# Patient Record
Sex: Female | Born: 1990 | Race: Black or African American | Hispanic: No | Marital: Single | Smoking: Current every day smoker
Health system: Southern US, Community
[De-identification: ages and names within clinical notes are randomized; demographics above are authoritative.]

## PROBLEM LIST (undated history)

## (undated) DIAGNOSIS — J45909 Unspecified asthma, uncomplicated: Secondary | ICD-10-CM

## (undated) HISTORY — PX: TONSILLECTOMY: SUR1361

---

## 2017-11-24 ENCOUNTER — Emergency Department (HOSPITAL_COMMUNITY)
Admission: EM | Admit: 2017-11-24 | Discharge: 2017-11-24 | Disposition: A | Discharge status: Home / Self Care | Payer: BLUE CROSS/BLUE SHIELD | Attending: Emergency Medicine | Admitting: Emergency Medicine

## 2017-11-24 ENCOUNTER — Encounter (HOSPITAL_COMMUNITY): Payer: Self-pay | Admitting: *Deleted

## 2017-11-24 ENCOUNTER — Emergency Department (HOSPITAL_COMMUNITY): Payer: BLUE CROSS/BLUE SHIELD

## 2017-11-24 DIAGNOSIS — Y999 Unspecified external cause status: Secondary | ICD-10-CM | POA: Diagnosis not present

## 2017-11-24 DIAGNOSIS — W19XXXA Unspecified fall, initial encounter: Secondary | ICD-10-CM | POA: Diagnosis not present

## 2017-11-24 DIAGNOSIS — Z23 Encounter for immunization: Secondary | ICD-10-CM | POA: Diagnosis not present

## 2017-11-24 DIAGNOSIS — S80211A Abrasion, right knee, initial encounter: Secondary | ICD-10-CM | POA: Diagnosis not present

## 2017-11-24 DIAGNOSIS — S93402A Sprain of unspecified ligament of left ankle, initial encounter: Secondary | ICD-10-CM | POA: Insufficient documentation

## 2017-11-24 DIAGNOSIS — Y939 Activity, unspecified: Secondary | ICD-10-CM | POA: Insufficient documentation

## 2017-11-24 DIAGNOSIS — Y929 Unspecified place or not applicable: Secondary | ICD-10-CM | POA: Diagnosis not present

## 2017-11-24 DIAGNOSIS — S99912A Unspecified injury of left ankle, initial encounter: Secondary | ICD-10-CM | POA: Diagnosis present

## 2017-11-24 DIAGNOSIS — T148XXA Other injury of unspecified body region, initial encounter: Secondary | ICD-10-CM

## 2017-11-24 DIAGNOSIS — S93409A Sprain of unspecified ligament of unspecified ankle, initial encounter: Secondary | ICD-10-CM

## 2017-11-24 MED ORDER — TETANUS-DIPHTH-ACELL PERTUSSIS 5-2.5-18.5 LF-MCG/0.5 IM SUSP
0.5000 mL | Freq: Once | INTRAMUSCULAR | Status: AC
  Administered 2017-11-24: 0.5 mL via INTRAMUSCULAR
  Filled 2017-11-24: qty 0.5

## 2017-11-24 MED ORDER — BACITRACIN ZINC 500 UNIT/GM EX OINT
1.0000 "application " | TOPICAL_OINTMENT | Freq: Once | CUTANEOUS | Status: AC
  Administered 2017-11-24: 1 via TOPICAL
  Filled 2017-11-24: qty 0.9

## 2017-11-24 MED ORDER — METHOCARBAMOL 500 MG PO TABS: 1000.0000 mg | ORAL_TABLET | Freq: Four times a day (QID) | ORAL | 0 refills | Status: DC | PRN

## 2017-11-24 NOTE — ED Notes (Signed)
Pt ambulatory and independent at discharge.  Verbalized understanding of discharge instructions 

## 2017-11-24 NOTE — ED Triage Notes (Signed)
Patient c/o right knee and left ankle pain after fall x2 days ago. Healing wound noted to right knee. Ambulatory.

## 2017-11-24 NOTE — ED Provider Notes (Signed)
[facility]-EMERGENCY DEPT Provider Note   CSN: [id number] Arrival date & time: 11/24/17  1217     History   Chief Complaint Chief Complaint  Patient presents with  . Ankle Pain   HPI   Blood pressure 107/60, pulse 79, temperature 98.2 F (36.8 C), temperature source Oral, resp. rate 14, last menstrual period 11/24/2017, SpO2 96 %.  Tara Fry is a 27 y.o. female complaining of ankle and right knee pain after fall yesterday.  She has been ambulatory but with pain, she has an abrasion to the right knee.  Last tetanus shot is unknown.  Pain is moderate and not alleviated with Tylenol ibuprofen.    History reviewed. No pertinent past medical history.  There are no active problems to display for this patient.   Past Surgical History:  Procedure Laterality Date  . TONSILLECTOMY      OB History    No data available       Home Medications    Prior to Admission medications   Medication Sig Start Date End Date Taking? Authorizing Provider  albuterol (PROVENTIL HFA;VENTOLIN HFA) 108 (90 Base) MCG/ACT inhaler Inhale 1-2 puffs into the lungs every 6 (six) hours as needed for wheezing or shortness of breath.   Yes [provider]  ibuprofen (ADVIL,MOTRIN) 200 MG tablet Take 400 mg by mouth every 6 (six) hours as needed for moderate pain.   Yes [provider]  methocarbamol (ROBAXIN) 500 MG tablet Take 2 tablets (1,000 mg total) by mouth 4 (four) times daily as needed (Pain). 11/24/17   Zak Gondek, Mardella Layman    Family History No family history on file.  Social History Social History   Tobacco Use  . Smoking status: Not on file  Substance Use Topics  . Alcohol use: Not on file  . Drug use: Not on file     Allergies   Patient has no known allergies.   Review of Systems Review of Systems  A complete review of systems was obtained and all systems are negative except as noted in the HPI and PMH.   Physical Exam Updated  Vital Signs BP 107/60 (BP Location: Right Arm)   Pulse 79   Temp 98.2 F (36.8 C) (Oral)   Resp 14   LMP 11/24/2017   SpO2 96%   Physical Exam  Constitutional: She is oriented to person, place, and time. She appears well-developed and well-nourished. No distress.  HENT:  Head: Normocephalic and atraumatic.  Mouth/Throat: Oropharynx is clear and moist.  Eyes: Conjunctivae and EOM are normal. Pupils are equal, round, and reactive to light.  Neck: Normal range of motion.  Cardiovascular: Normal rate, regular rhythm and intact distal pulses.  Pulmonary/Chest: Effort normal and breath sounds normal.  Abdominal: Soft. There is no tenderness.  Musculoskeletal: Normal range of motion.  Left ankle:  No deformity, no overlying skin changes, mild swelling and tenderness to palpation along the inferior, lateral malleolus. No bony tenderness palpation, distally neurovascularly intact.  Right knee:  No deformity, erythema.  2.5 cm abrasion with no warmth or surrounding cellulitis.  FROM. No effusion or crepitance. Anterior and posterior drawer show no abnormal laxity. Stable to valgus and varus stress. Joint lines are non-tender. Neurovascularly intact. Pt ambulates with non-antalgic gait.     Neurological: She is alert and oriented to person, place, and time.  Skin: She is not diaphoretic.  Psychiatric: She has a normal mood and affect.  Nursing note and vitals reviewed.    ED  Treatments / Results  Labs (all labs ordered are listed, but only abnormal results are displayed) Labs Reviewed - No data to display  EKG  EKG Interpretation None       Radiology Dg Ankle Complete Left  Result Date: 11/24/2017 CLINICAL DATA:  Fall ankle pain EXAM: LEFT ANKLE COMPLETE - 3+ VIEW COMPARISON:  None. FINDINGS: There is no evidence of fracture, dislocation, or joint effusion. There is no evidence of arthropathy or other focal bone abnormality. Soft tissues are unremarkable. IMPRESSION: Negative.  Electronically Signed   By: Marlan Palauharles  Clark M.D.   On: 11/24/2017 12:59    Procedures Procedures (including critical care time)  Medications Ordered in ED Medications  bacitracin ointment 1 application (not administered)     Initial Impression / Assessment and Plan / ED Course  I have reviewed the triage vital signs and the nursing notes.  Pertinent labs & imaging results that were available during my care of the patient were reviewed by me and considered in my medical decision making (see chart for details).     Vitals:   11/24/17 1235  BP: 107/60  Pulse: 79  Resp: 14  Temp: 98.2 F (36.8 C)  TempSrc: Oral  SpO2: 96%    Medications  bacitracin ointment 1 application (not administered)    Tara Fry is [age] y.o. female presenting with left ankle and right knee pain status post fall.  Patient has significant edema to the left ankle x-ray negative, she is ambulatory, neurovascularly intact.  Right knee with abrasion, tetanus is updated have counseled her on wound care and return precautions.  Patient will be given crutches, Ace wrap.  Evaluation does not show pathology that would require ongoing emergent intervention or inpatient treatment. Pt is hemodynamically stable and mentating appropriately. Discussed findings and plan with patient/guardian, who agrees with care plan. All questions answered. Return precautions discussed and outpatient follow up given.      Final Clinical Impressions(s) / ED Diagnoses   Final diagnoses:  Sprain of ankle, unspecified laterality, unspecified ligament, initial encounter  Abrasion    ED Discharge Orders        Ordered    methocarbamol (ROBAXIN) 500 MG tablet  4 times daily PRN     11/24/17 1503       Cole Eastridge, Mardella Laymanicole, PA-C 11/24/17 1505    Doug SouJacubowitz, Sam, MD 11/24/17 1635

## 2017-11-24 NOTE — Discharge Instructions (Signed)
Wash the affected area with soap and water and apply a thin layer of topical antibiotic ointment. Do this every 12 hours.   Do not use rubbing alcohol or hydrogen peroxide.                        Look for signs of infection: if you see redness, if the area becomes warm, if pain increases sharply, there is discharge (pus), if red streaks appear or you develop fever or vomiting, RETURN immediately to the Emergency Department  for a recheck.    Rest, Ice intermittently (in the first 24-48 hours), Gentle compression with an Ace wrap, and elevate (Limb above the level of the heart)   Take up to 800mg  of ibuprofen (that is usually 4 over the counter pills)  3 times a day for 5 days. Take with food.   For pain control you may take up to 800mg  of Motrin (also known as ibuprofen). That is usually 4 over the counter pills,  3 times a day. Take with food to minimize stomach irritation   For breakthrough pain you may take Robaxin. Do not drink alcohol, drive or operate heavy machinery when taking Robaxin.  Please follow with your primary care doctor in the next 2 days for a check-up. They must obtain records for further management.   Do not hesitate to return to the Emergency Department for any new, worsening or concerning symptoms.

## 2018-03-09 ENCOUNTER — Ambulatory Visit (HOSPITAL_COMMUNITY)
Admission: EM | Admit: 2018-03-09 | Discharge: 2018-03-09 | Disposition: A | Discharge status: Home / Self Care | Payer: BLUE CROSS/BLUE SHIELD | Attending: Family Medicine | Admitting: Family Medicine

## 2018-03-09 ENCOUNTER — Encounter (HOSPITAL_COMMUNITY): Payer: Self-pay | Admitting: Family Medicine

## 2018-03-09 ENCOUNTER — Telehealth (HOSPITAL_COMMUNITY): Payer: Self-pay | Admitting: Emergency Medicine

## 2018-03-09 DIAGNOSIS — N898 Other specified noninflammatory disorders of vagina: Secondary | ICD-10-CM | POA: Insufficient documentation

## 2018-03-09 DIAGNOSIS — R300 Dysuria: Secondary | ICD-10-CM | POA: Diagnosis not present

## 2018-03-09 DIAGNOSIS — Z3202 Encounter for pregnancy test, result negative: Secondary | ICD-10-CM | POA: Diagnosis not present

## 2018-03-09 DIAGNOSIS — Z791 Long term (current) use of non-steroidal anti-inflammatories (NSAID): Secondary | ICD-10-CM | POA: Diagnosis not present

## 2018-03-09 DIAGNOSIS — Z79899 Other long term (current) drug therapy: Secondary | ICD-10-CM | POA: Insufficient documentation

## 2018-03-09 DIAGNOSIS — Z9889 Other specified postprocedural states: Secondary | ICD-10-CM | POA: Insufficient documentation

## 2018-03-09 LAB — POCT URINALYSIS DIP (DEVICE)
GLUCOSE, UA: NEGATIVE mg/dL
GLUCOSE, UA: NEGATIVE mg/dL
Hgb urine dipstick: NEGATIVE
KETONES UR: 40 mg/dL — AB
KETONES UR: 80 mg/dL — AB
Nitrite: NEGATIVE
Nitrite: NEGATIVE
Protein, ur: NEGATIVE mg/dL
Protein, ur: NEGATIVE mg/dL
SPECIFIC GRAVITY, URINE: 1.025 (ref 1.005–1.030)
Specific Gravity, Urine: 1.02 (ref 1.005–1.030)
UROBILINOGEN UA: 2 mg/dL — AB (ref 0.0–1.0)
UROBILINOGEN UA: 2 mg/dL — AB (ref 0.0–1.0)
pH: 6 (ref 5.0–8.0)
pH: 6.5 (ref 5.0–8.0)

## 2018-03-09 LAB — POCT PREGNANCY, URINE: Preg Test, Ur: NEGATIVE

## 2018-03-09 MED ORDER — METRONIDAZOLE 500 MG PO TABS: 500.0000 mg | ORAL_TABLET | Freq: Two times a day (BID) | ORAL | 0 refills | Status: DC

## 2018-03-09 MED ORDER — FLUCONAZOLE 150 MG PO TABS: 150.0000 mg | ORAL_TABLET | Freq: Every day | ORAL | 0 refills | Status: DC

## 2018-03-09 NOTE — ED Provider Notes (Signed)
[facility]    CSN: [id number] Arrival date & time: 03/09/18  1303     History   Chief Complaint Chief Complaint  Patient presents with  . Vaginitis    HPI Tara Fry is a 27 y.o. female.   27 year old female comes in for 2-day history of vaginal discharge/irritation.  States discharge is thick, can be clumpy.  Irritation to the labia.  Has some dysuria without urinary frequency, urgency, hematuria.  Has had some spotting, states contributed to recent start of NuvaRing use.  LMP 02/15/2018.  Denies fever, chills, night sweats.  Denies abdominal pain, nausea, vomiting.  Sexually active with one female partner, no condom use.     History reviewed. No pertinent past medical history.  There are no active problems to display for this patient.   Past Surgical History:  Procedure Laterality Date  . TONSILLECTOMY      OB History   None      Home Medications    Prior to Admission medications   Medication Sig Start Date End Date Taking? Authorizing Provider  albuterol (PROVENTIL HFA;VENTOLIN HFA) 108 (90 Base) MCG/ACT inhaler Inhale 1-2 puffs into the lungs every 6 (six) hours as needed for wheezing or shortness of breath.    [provider]  fluconazole (DIFLUCAN) 150 MG tablet Take 1 tablet (150 mg total) by mouth daily. Take second dose 72 hours later if symptoms still persists. 03/09/18   Cathie HoopsYu, Daritza Brees V, PA-C  ibuprofen (ADVIL,MOTRIN) 200 MG tablet Take 400 mg by mouth every 6 (six) hours as needed for moderate pain.    [provider]  metroNIDAZOLE (FLAGYL) 500 MG tablet Take 1 tablet (500 mg total) by mouth 2 (two) times daily. 03/09/18   Belinda FisherYu, Elvan Ebron V, PA-C    Family History History reviewed. No pertinent family history.  Social History Social History   Tobacco Use  . Smoking status: Never Smoker  . Smokeless tobacco: Never Used  Substance Use Topics  . Alcohol use: Not on file  . Drug use: Not on file     Allergies   Patient has no  known allergies.   Review of Systems Review of Systems  Reason unable to perform ROS: See HPI as above.     Physical Exam Triage Vital Signs ED Triage Vitals  Enc Vitals Group     BP 03/09/18 1337 127/67     Pulse Rate 03/09/18 1337 81     Resp 03/09/18 1337 18     Temp 03/09/18 1337 98.4 F (36.9 C)     Temp src --      SpO2 03/09/18 1337 100 %     Weight --      Height --      Head Circumference --      Peak Flow --      Pain Score 03/09/18 1335 10     Pain Loc --      Pain Edu? --      Excl. in GC? --    No data found.  Updated Vital Signs BP 127/67   Pulse 81   Temp 98.4 F (36.9 C)   Resp 18   LMP 02/15/2018   SpO2 100%   Physical Exam  Constitutional: She is oriented to person, place, and time. She appears well-developed and well-nourished. No distress.  HENT:  Head: Normocephalic and atraumatic.  Eyes: Pupils are equal, round, and reactive to light. Conjunctivae are normal.  Cardiovascular: Normal rate, regular rhythm and normal heart  sounds. Exam reveals no gallop and no friction rub.  No murmur heard. Pulmonary/Chest: Effort normal and breath sounds normal. She has no wheezes. She has no rales.  Abdominal: Soft. Bowel sounds are normal. She exhibits no mass. There is no tenderness. There is no rebound, no guarding and no CVA tenderness.  Genitourinary: There is no rash, tenderness or lesion on the right labia. There is no rash, tenderness or lesion on the left labia.  Genitourinary Comments: No ulceration/lesions found. Pelvic and bimanual exam deferred.   Neurological: She is alert and oriented to person, place, and time.  Skin: Skin is warm and dry.  Psychiatric: She has a normal mood and affect. Her behavior is normal. Judgment normal.     UC Treatments / Results  Labs (all labs ordered are listed, but only abnormal results are displayed) Labs Reviewed  POCT URINALYSIS DIP (DEVICE) - Abnormal; Notable for the following components:      Result  Value   Bilirubin Urine SMALL (*)    Ketones, ur 40 (*)    Hgb urine dipstick TRACE (*)    Urobilinogen, UA 2.0 (*)    Leukocytes, UA LARGE (*)    All other components within normal limits  URINE CULTURE  POCT PREGNANCY, URINE    EKG None Radiology No results found.  Procedures Procedures (including critical care time)  Medications Ordered in UC Medications - No data to display   Initial Impression / Assessment and Plan / UC Course  I have reviewed the triage vital signs and the nursing notes.  Pertinent labs & imaging results that were available during my care of the patient were reviewed by me and considered in my medical decision making (see chart for details).    Urine dipstick was ran with dirty urine. Clean catch obtained with small leukocytes. Will send for urine culture for further evaluation needed. Patient was treated empirically for BV and yeast. Start diflucan and flagyl as directed. Cytology sent, patient will be contacted with any positive results that require additional treatment. Patient to refrain from sexual activity for the next 7 days. Return precautions given.    Final Clinical Impressions(s) / UC Diagnoses   Final diagnoses:  Vaginal irritation    ED Discharge Orders        Ordered    metroNIDAZOLE (FLAGYL) 500 MG tablet  2 times daily     03/09/18 1434    fluconazole (DIFLUCAN) 150 MG tablet  Daily     03/09/18 1434        [address] [place] 03/09/18 1522

## 2018-03-09 NOTE — ED Triage Notes (Signed)
Pt here for vaginal discharge and vaginal irritation x 2 days.

## 2018-03-09 NOTE — Discharge Instructions (Addendum)
You were treated empirically for BV and yeast. Start diflucan and flagyl as directed. Cytology sent, you will be contacted with any positive results that requires further treatment. Refrain from sexual activity and alcohol use for the next 7 days. Monitor for any worsening of symptoms, fever, abdominal pain, nausea, vomiting, to follow up for reevaluation. ° °

## 2018-03-09 NOTE — Telephone Encounter (Signed)
Per Linward HeadlandAmy Fry request called pt to inform of clean urine results

## 2018-03-11 ENCOUNTER — Telehealth (HOSPITAL_COMMUNITY): Payer: Self-pay

## 2018-03-11 LAB — URINE CULTURE: Culture: 30000 — AB

## 2018-03-11 MED ORDER — NITROFURANTOIN MONOHYD MACRO 100 MG PO CAPS: 100.0000 mg | ORAL_CAPSULE | Freq: Two times a day (BID) | ORAL | 0 refills | Status: AC

## 2018-03-11 NOTE — Telephone Encounter (Signed)
Urine culture was positive for E. Coli, no antibiotics given at urgent care visit. Prescription for Macrobid 100 mg, BID x 5 days no refills  per Dr. Dayton ScrapeMurray sent to pharmacy of choice. Pt called and made aware. Pt educated to follow up if symptoms are not improving. Verbalized understanding.

## 2018-03-15 ENCOUNTER — Emergency Department (HOSPITAL_COMMUNITY)
Admission: EM | Admit: 2018-03-15 | Discharge: 2018-03-15 | Disposition: A | Discharge status: Home / Self Care | Payer: BLUE CROSS/BLUE SHIELD | Attending: Emergency Medicine | Admitting: Emergency Medicine

## 2018-03-15 ENCOUNTER — Encounter (HOSPITAL_COMMUNITY): Payer: Self-pay | Admitting: *Deleted

## 2018-03-15 DIAGNOSIS — N898 Other specified noninflammatory disorders of vagina: Secondary | ICD-10-CM | POA: Diagnosis present

## 2018-03-15 DIAGNOSIS — J45909 Unspecified asthma, uncomplicated: Secondary | ICD-10-CM | POA: Insufficient documentation

## 2018-03-15 HISTORY — DX: Unspecified asthma, uncomplicated: J45.909

## 2018-03-15 LAB — URINALYSIS, ROUTINE W REFLEX MICROSCOPIC
Bilirubin Urine: NEGATIVE
Glucose, UA: NEGATIVE mg/dL
HGB URINE DIPSTICK: NEGATIVE
Ketones, ur: NEGATIVE mg/dL
NITRITE: NEGATIVE
Protein, ur: NEGATIVE mg/dL
SPECIFIC GRAVITY, URINE: 1.018 (ref 1.005–1.030)
pH: 6 (ref 5.0–8.0)

## 2018-03-15 LAB — RAPID HIV SCREEN (HIV 1/2 AB+AG)
HIV 1/2 Antibodies: NONREACTIVE
HIV-1 P24 ANTIGEN - HIV24: NONREACTIVE

## 2018-03-15 LAB — WET PREP, GENITAL
Clue Cells Wet Prep HPF POC: NONE SEEN
Sperm: NONE SEEN
Trich, Wet Prep: NONE SEEN
YEAST WET PREP: NONE SEEN

## 2018-03-15 LAB — PREGNANCY, URINE: Preg Test, Ur: NEGATIVE

## 2018-03-15 MED ORDER — VALACYCLOVIR HCL 1 G PO TABS: 1000.0000 mg | ORAL_TABLET | Freq: Two times a day (BID) | ORAL | 0 refills | Status: AC

## 2018-03-15 MED ORDER — ACETAMINOPHEN 325 MG PO TABS
650.0000 mg | ORAL_TABLET | Freq: Once | ORAL | Status: AC
  Administered 2018-03-15: 650 mg via ORAL
  Filled 2018-03-15: qty 2

## 2018-03-15 NOTE — ED Triage Notes (Signed)
Pt stated "I have something that's sore near my butt crack.  It feels like a lump or something.  It came up Sunday morning."

## 2018-03-15 NOTE — ED Notes (Signed)
Rapid HIV non-reactive.

## 2018-03-15 NOTE — Discharge Instructions (Addendum)
You have chlamydia, gonorrhea, HIV, syphilis and herpes testing which is pending. You will get a phone call if positive.   Please take Valtrex twice a day for your symptoms. You can also take tylenol at home for pain.   Please refrain from sexual activity until the sores on your vagina are gone.   Please establish care with a regular doctor like we discussed.  There is a 1 [phone] number that you can call at the back of this packet to help you find a doctor.  I have also listed the information to the health department should you have future concerns.  Return to the ER if you have any new or concerning symptoms like fever, chills, vomiting that will not stop, abdominal pain.

## 2018-03-15 NOTE — ED Provider Notes (Signed)
[facility]ER[facility] Provider Note   CSN: [id number] Arrival date & time: 03/15/18  0607     History   Chief Complaint Chief Complaint  Patient presents with  . Abscess    HPI Teressa LowerJaslyn Chauca is a 27 y.o. female.  HPI  Ms. Andree Elkickelson is a 27 year old female with a history of asthma who presents to the emergency department for evaluation of vaginal irritation.  Patient states that she noticed a sore on her vagina yesterday which is painful to the touch.  She states the pain is 8/10 in severity and feels burning.  Reports that pain is worsened when she urinates or touches that area.  Has not taken any over-the-counter medications for her symptoms.  States that she feels as if the area is swollen.  She denies history of HSV.  She is in a monogamous relationship with one female partner, denies regular condom use.  States that she was seen at an urgent care about a week ago and diagnosed with BV, yeast infection.  Reports that her vaginal discharge has improved with diflucan and flagyl.  Reports LMP 02/15/2018.  She denies fevers, chills, abdominal pain, nausea/vomiting, urinary frequency, flank pain, hematuria.  Past Medical History:  Diagnosis Date  . Asthma     There are no active problems to display for this patient.   Past Surgical History:  Procedure Laterality Date  . TONSILLECTOMY       OB History   None      Home Medications    Prior to Admission medications   Medication Sig Start Date End Date Taking? Authorizing Provider  albuterol (PROVENTIL HFA;VENTOLIN HFA) 108 (90 Base) MCG/ACT inhaler Inhale 1-2 puffs into the lungs every 6 (six) hours as needed for wheezing or shortness of breath.   Yes [provider]  ibuprofen (ADVIL,MOTRIN) 200 MG tablet Take 400 mg by mouth every 6 (six) hours as needed for moderate pain.   Yes [provider]  metroNIDAZOLE (FLAGYL) 500 MG tablet Take 1 tablet (500 mg total) by mouth 2 (two)  times daily. 03/09/18  Yes Yu, Amy V, PA-C  nitrofurantoin, macrocrystal-monohydrate, (MACROBID) 100 MG capsule Take 1 capsule (100 mg total) by mouth 2 (two) times daily for 5 days. 03/11/18 03/16/18 Yes Isa RankinMurray, Laura Wilson, MD  NUVARING 0.12-0.015 MG/24HR vaginal ring Place 1 Device vaginally every 21 ( twenty-one) days. Then remove for 7 days 02/17/18  Yes [provider]  fluconazole (DIFLUCAN) 150 MG tablet Take 1 tablet (150 mg total) by mouth daily. Take second dose 72 hours later if symptoms still persists. Patient not taking: Reported on 03/15/2018 03/09/18   Lurline IdolYu, Amy V, PA-C    Family History No family history on file.  Social History Social History   Tobacco Use  . Smoking status: Never Smoker  . Smokeless tobacco: Never Used  Substance Use Topics  . Alcohol use: Yes    Comment: socially  . Drug use: Never     Allergies   Patient has no known allergies.   Review of Systems Review of Systems  Constitutional: Negative for chills and fever.  Gastrointestinal: Negative for abdominal pain, diarrhea, nausea and vomiting.  Genitourinary: Positive for dysuria and genital sores (sore on vagina). Negative for flank pain, frequency, vaginal bleeding and vaginal discharge.     Physical Exam Updated Vital Signs BP 123/71 (BP Location: Left Arm)   Pulse 74   Temp 98.2 F (36.8 C) (Oral)   Resp 16   Ht 5'  5" (1.651 m)   Wt 79.4 kg (175 lb)   LMP 02/15/2018 (Approximate)   SpO2 100%   BMI 29.12 kg/m   Physical Exam  Constitutional: She appears well-developed and well-nourished. No distress.  HENT:  Head: Normocephalic and atraumatic.  Eyes: Right eye exhibits no discharge. Left eye exhibits no discharge.  Pulmonary/Chest: Effort normal. No respiratory distress.  Abdominal: Soft. Bowel sounds are normal. There is no tenderness. There is no guarding.  Genitourinary:     Genitourinary Comments: Chaperone present for exam. One (~34mm) open ulcer on the inferior  labia majora as depicted in image. Two painful grouped vesicles over the left labia majora. White milky discharge present. No CMT. No adnexal masses, tenderness, or fullness.  No bleeding within vaginal vault. Nuva Ring present.  Neurological: She is alert. Coordination normal.  Skin: Skin is warm and dry. Capillary refill takes less than 2 seconds. She is not diaphoretic.  Psychiatric: She has a normal mood and affect. Her behavior is normal.  Nursing note and vitals reviewed.    ED Treatments / Results  Labs (all labs ordered are listed, but only abnormal results are displayed) Labs Reviewed  WET PREP, GENITAL - Abnormal; Notable for the following components:      Result Value   WBC, Wet Prep HPF POC FEW (*)    All other components within normal limits  URINALYSIS, ROUTINE W REFLEX MICROSCOPIC - Abnormal; Notable for the following components:   APPearance HAZY (*)    Leukocytes, UA SMALL (*)    Bacteria, UA RARE (*)    Squamous Epithelial / LPF 6-30 (*)    All other components within normal limits  HSV CULTURE AND TYPING  URINE CULTURE  RAPID HIV SCREEN (HIV 1/2 AB+AG)  PREGNANCY, URINE  RPR  GC/CHLAMYDIA PROBE AMP ([place]) NOT AT [facility]    EKG None  Radiology No results found.  Procedures Procedures (including critical care time)  Medications Ordered in ED Medications  acetaminophen (TYLENOL) tablet 650 mg (650 mg Oral Given 03/15/18 1118)     Initial Impression / Assessment and Plan / ED Course  I have reviewed the triage vital signs and the nursing notes.  Pertinent labs & imaging results that were available during my care of the patient were reviewed by me and considered in my medical decision making (see chart for details).     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  She has no prior history of this.  Plan to treat with 1 g Valtrex twice daily for the next 7 days.  Urine pregnancy negative.  Wet prep with few WBC's, otherwise unremarkable. UA appears  contaminated (mucus present) with small WBCs and rare bacteria.  No CMT, fever or abdominal pain. No concern for PID.  No mass, area of swelling or concern for abscess.  Patient requested HSV culture, which has been sent to the lab.  Patient understands that she also has GC/chlamydia, HIV, syphilis testing which is pending and that she will need to go to the health department to be treated if any of these tests return positive. Discussed follow-up with PCP and have given her information in her discharge paperwork to establish care.  Counseled her on return precautions and she agrees.  Final Clinical Impressions(s) / ED Diagnoses   Final diagnoses:  Vaginal lesion    ED Discharge Orders    None       Lawrence Marseilles 03/15/18 1604    Lorre Nick, MD 03/16/18 [id number]

## 2018-03-16 LAB — URINE CULTURE: Culture: 10000 — AB

## 2018-03-16 LAB — GC/CHLAMYDIA PROBE AMP (~~LOC~~) NOT AT ARMC
Chlamydia: NEGATIVE
NEISSERIA GONORRHEA: NEGATIVE

## 2018-03-16 LAB — RPR: RPR Ser Ql: NONREACTIVE

## 2018-03-17 ENCOUNTER — Telehealth: Payer: Self-pay | Admitting: Emergency Medicine

## 2018-03-17 LAB — HSV CULTURE AND TYPING

## 2018-03-17 NOTE — Telephone Encounter (Signed)
Post ED Visit - Positive Culture Follow-up  Culture report reviewed by antimicrobial stewardship pharmacist:  []  Tara Fry, Pharm.D. []  Tara Fry, [address]Ph[address] BCPS AQ-ID []  Tara Fry, Pharm.D., BCPS []  Tara Fry, Pharm.D., BCPS []  Tara Fry, [place] BCPS, AAHIVP []  Tara Fry, Pharm.D., BCPS, AAHIVP []  Tara Fry, PharmD, BCPS []  Tara Fry, PharmD []  Tara Fry, PharmD, BCPS Tara Fry PharmD  Positive urine culture Treated with none,  no further patient follow-up is required at this time.  Tara Fry, Tara Fry 03/17/2018, 1:45 PM

## 2018-03-17 NOTE — Progress Notes (Signed)
ED Antimicrobial Stewardship Positive Culture Follow Up   Tara Fry is an 27 y.o. female who presented to [facility]2018 with a chief complaint of vaginal irritation. Chief Complaint  Patient presents with  . Abscess    Recent Results (from the past 720 hour(s))  Urine culture     Status: Abnormal   Collection Time: 03/09/18  3:21 PM  Result Value Ref Range Status   Specimen Description URINE, CLEAN CATCH  Final   Special Requests   Final    NONE Performed at [facility], [address] [place], [place] [address]    Culture 30,000 COLONIES/mL ESCHERICHIA COLI (A)  Final   Report Status 03/11/2018 FINAL  Final   Organism ID, Bacteria ESCHERICHIA COLI (A)  Final      Susceptibility   Escherichia coli - MIC*    AMPICILLIN >=32 RESISTANT Resistant     CEFAZOLIN <=4 SENSITIVE Sensitive     CEFTRIAXONE <=1 SENSITIVE Sensitive     CIPROFLOXACIN <=0.25 SENSITIVE Sensitive     GENTAMICIN <=1 SENSITIVE Sensitive     IMIPENEM <=0.25 SENSITIVE Sensitive     NITROFURANTOIN 32 SENSITIVE Sensitive     TRIMETH/SULFA >=320 RESISTANT Resistant     AMPICILLIN/SULBACTAM >=32 RESISTANT Resistant     PIP/TAZO <=4 SENSITIVE Sensitive     Extended ESBL NEGATIVE Sensitive     * 30,000 COLONIES/mL ESCHERICHIA COLI  Wet prep, genital     Status: Abnormal   Collection Time: 03/15/18  9:57 AM  Result Value Ref Range Status   Yeast Wet Prep HPF POC NONE SEEN NONE SEEN Final   Trich, Wet Prep NONE SEEN NONE SEEN Final   Clue Cells Wet Prep HPF POC NONE SEEN NONE SEEN Final   WBC, Wet Prep HPF POC FEW (A) NONE SEEN Final   Sperm NONE SEEN  Final    Comment: Performed at [facility], [address] [place], [place] [address]  Urine culture     Status: Abnormal   Collection Time: 03/15/18 10:45 AM  Result Value Ref Range Status   Specimen Description   Final    URINE, CLEAN CATCH Performed at [facility], [address]  [place], [place] [address]    Special Requests   Final    NONE Performed at [facility], [address] [place], [place] [address]    Culture (A)  Final    10,000 COLONIES/mL GROUP B STREP(S.AGALACTIAE)ISOLATED TESTING AGAINST S. AGALACTIAE NOT ROUTINELY PERFORMED DUE TO PREDICTABILITY OF AMP/PEN/VAN SUSCEPTIBILITY. Performed at [facility], [address] [place], [place] [address]    Report Status 03/16/2018 FINAL  Final   Patient physical exam consistent with HSV infection. Patient given valacyclovir and HSV test pending.   Plan to finish valacyclovir course.  ED Provider: Elson AreasLeslie K Sofia PA-C   Anselm PancoastAmy Kohl Polinsky, PharmD Candidate 03/17/2018, 9:33 AM Phone# [phone]

## 2019-02-02 ENCOUNTER — Ambulatory Visit (HOSPITAL_COMMUNITY)
Admission: EM | Admit: 2019-02-02 | Discharge: 2019-02-02 | Disposition: A | Discharge status: Home / Self Care | Payer: BLUE CROSS/BLUE SHIELD | Attending: Family Medicine | Admitting: Family Medicine

## 2019-02-02 ENCOUNTER — Encounter (HOSPITAL_COMMUNITY): Payer: Self-pay | Admitting: Emergency Medicine

## 2019-02-02 DIAGNOSIS — Z113 Encounter for screening for infections with a predominantly sexual mode of transmission: Secondary | ICD-10-CM

## 2019-02-02 DIAGNOSIS — Z3202 Encounter for pregnancy test, result negative: Secondary | ICD-10-CM

## 2019-02-02 DIAGNOSIS — R8290 Unspecified abnormal findings in urine: Secondary | ICD-10-CM | POA: Insufficient documentation

## 2019-02-02 LAB — POCT URINALYSIS DIP (DEVICE)
Bilirubin Urine: NEGATIVE
Glucose, UA: NEGATIVE mg/dL
HGB URINE DIPSTICK: NEGATIVE
Ketones, ur: NEGATIVE mg/dL
NITRITE: POSITIVE — AB
PH: 7 (ref 5.0–8.0)
PROTEIN: NEGATIVE mg/dL
SPECIFIC GRAVITY, URINE: 1.02 (ref 1.005–1.030)
UROBILINOGEN UA: 0.2 mg/dL (ref 0.0–1.0)

## 2019-02-02 LAB — POCT PREGNANCY, URINE: Preg Test, Ur: NEGATIVE

## 2019-02-02 MED ORDER — NITROFURANTOIN MONOHYD MACRO 100 MG PO CAPS: 100.0000 mg | ORAL_CAPSULE | Freq: Two times a day (BID) | ORAL | 0 refills | Status: AC

## 2019-02-02 NOTE — ED Provider Notes (Signed)
[facility]    CSN: [id number] Arrival date & time: 02/02/19  1148     History   Chief Complaint Chief Complaint  Patient presents with  . Urine Odor  . SEXUALLY TRANSMITTED DISEASE    HPI Tara Fry is a 28 y.o. female history of asthma presenting today for evaluation of possible UTI versus STDs.  Patient states that over the past week she has had increased odor in her urine.  She notes that she has a history of UTI as well as BV.  Denies any vaginal discharge.  Denies any pelvic pain, itching or irritation.  Denies increased frequency with urination or dysuria.  She would like to be screened for STDs as well as UTI.  Denies fever, nausea vomiting or diarrhea.  Menstrual period ended approximately 1 week ago.  Is not on any form of birth control.  Denies any new partners. HPI  Past Medical History:  Diagnosis Date  . Asthma     There are no active problems to display for this patient.   Past Surgical History:  Procedure Laterality Date  . TONSILLECTOMY      OB History   No obstetric history on file.      Home Medications    Prior to Admission medications   Medication Sig Start Date End Date Taking? Authorizing Provider  albuterol (PROVENTIL HFA;VENTOLIN HFA) 108 (90 Base) MCG/ACT inhaler Inhale 1-2 puffs into the lungs every 6 (six) hours as needed for wheezing or shortness of breath.    [provider]  ibuprofen (ADVIL,MOTRIN) 200 MG tablet Take 400 mg by mouth every 6 (six) hours as needed for moderate pain.    [provider]  nitrofurantoin, macrocrystal-monohydrate, (MACROBID) 100 MG capsule Take 1 capsule (100 mg total) by mouth 2 (two) times daily for 5 days. 02/02/19 02/07/19  Joycie Aerts C, PA-C  NUVARING 0.12-0.015 MG/24HR vaginal ring Place 1 Device vaginally every 21 ( twenty-one) days. Then remove for 7 days 02/17/18   [provider]    Family History No family history on file.  Social History Social  History   Tobacco Use  . Smoking status: Never Smoker  . Smokeless tobacco: Never Used  Substance Use Topics  . Alcohol use: Yes    Comment: socially  . Drug use: Never     Allergies   Patient has no known allergies.   Review of Systems Review of Systems  Constitutional: Negative for fever.  Respiratory: Negative for shortness of breath.   Cardiovascular: Negative for chest pain.  Gastrointestinal: Negative for abdominal pain, diarrhea, nausea and vomiting.  Genitourinary: Negative for dysuria, flank pain, genital sores, hematuria, menstrual problem, vaginal bleeding, vaginal discharge and vaginal pain.  Musculoskeletal: Negative for back pain.  Skin: Negative for rash.  Neurological: Negative for dizziness, light-headedness and headaches.     Physical Exam Triage Vital Signs ED Triage Vitals [02/02/19 1211]  Enc Vitals Group     BP (!) 114/59     Pulse Rate 72     Resp 16     Temp 98.2 F (36.8 C)     Temp src      SpO2 100 %     Weight      Height      Head Circumference      Peak Flow      Pain Score 0     Pain Loc      Pain Edu?      Excl. in GC?  No data found.  Updated Vital Signs BP (!) 114/59   Pulse 72   Temp 98.2 F (36.8 C)   Resp 16   LMP 01/26/2019   SpO2 100%   Visual Acuity Right Eye Distance:   Left Eye Distance:   Bilateral Distance:    Right Eye Near:   Left Eye Near:    Bilateral Near:     Physical Exam Vitals signs and nursing note reviewed.  Constitutional:      Appearance: She is well-developed.     Comments: No acute distress  HENT:     Head: Normocephalic and atraumatic.     Nose: Nose normal.  Eyes:     Conjunctiva/sclera: Conjunctivae normal.  Neck:     Musculoskeletal: Neck supple.  Cardiovascular:     Rate and Rhythm: Normal rate.  Pulmonary:     Effort: Pulmonary effort is normal. No respiratory distress.  Abdominal:     General: There is no distension.     Comments: Nontender to light D palpation  throughout abdomen  Musculoskeletal: Normal range of motion.  Skin:    General: Skin is warm and dry.  Neurological:     Mental Status: She is alert and oriented to person, place, and time.      UC Treatments / Results  Labs (all labs ordered are listed, but only abnormal results are displayed) Labs Reviewed  POCT URINALYSIS DIP (DEVICE) - Abnormal; Notable for the following components:      Result Value   Nitrite POSITIVE (*)    Leukocytes,Ua TRACE (*)    All other components within normal limits  URINE CULTURE  POC URINE PREG, ED  CERVICOVAGINAL ANCILLARY ONLY    EKG None  Radiology No results found.  Procedures Procedures (including critical care time)  Medications Ordered in UC Medications - No data to display  Initial Impression / Assessment and Plan / UC Course  I have reviewed the triage vital signs and the nursing notes.  Pertinent labs & imaging results that were available during my care of the patient were reviewed by me and considered in my medical decision making (see chart for details).     Positive nitrites, trace leuks, will go ahead and empirically treat for UTI with Macrobid twice daily x5 days.  Urine culture sent, will call and alter treatment if needed.  Vaginal swab also obtained to check for STDs, BV and yeast.  Will inform of results.Discussed strict return precautions. Patient verbalized understanding and is agreeable with plan.  Final Clinical Impressions(s) / UC Diagnoses   Final diagnoses:  Bad odor of urine  Screen for STD (sexually transmitted disease)     Discharge Instructions     Urine showed evidence of infection. We are treating you with macrobid- twice daily for 5 days. Be sure to take full course. Stay hydrated- urine should be pale yellow to clear. My continue azo for relief of burning while infection is being cleared.   Please return or follow up with your primary provider if symptoms not improving with treatment. Please  return sooner if you have worsening of symptoms or develop fever, nausea, vomiting, abdominal pain, back pain, lightheadedness, dizziness.   ED Prescriptions    Medication Sig Dispense Auth. Provider   nitrofurantoin, macrocrystal-monohydrate, (MACROBID) 100 MG capsule Take 1 capsule (100 mg total) by mouth 2 (two) times daily for 5 days. 10 capsule Prather Failla C, PA-C     Controlled Substance Prescriptions [place] Controlled Substance Registry consulted? Not Applicable  Lew Dawes, PA-C 02/02/19 1252

## 2019-02-02 NOTE — Discharge Instructions (Signed)
Urine showed evidence of infection. We are treating you with macrobid twice daily for 5 days. Be sure to take full course. Stay hydrated- urine should be pale yellow to clear. My continue azo for relief of burning while infection is being cleared.  ° °Please return or follow up with your primary provider if symptoms not improving with treatment. Please return sooner if you have worsening of symptoms or develop fever, nausea, vomiting, abdominal pain, back pain, lightheadedness, dizziness. °

## 2019-02-02 NOTE — ED Triage Notes (Signed)
Pt requesting testing for UTI and STDs. Only symptom is her urine has an odor.

## 2019-02-03 LAB — CERVICOVAGINAL ANCILLARY ONLY
Bacterial vaginitis: NEGATIVE
CANDIDA VAGINITIS: POSITIVE — AB
Chlamydia: NEGATIVE
Neisseria Gonorrhea: NEGATIVE
TRICH (WINDOWPATH): NEGATIVE

## 2019-02-04 LAB — URINE CULTURE

## 2019-02-07 ENCOUNTER — Telehealth (HOSPITAL_COMMUNITY): Payer: Self-pay | Admitting: Emergency Medicine

## 2019-02-07 MED ORDER — FLUCONAZOLE 150 MG PO TABS: 150.0000 mg | ORAL_TABLET | Freq: Once | ORAL | 0 refills | Status: AC

## 2019-02-07 NOTE — Telephone Encounter (Signed)
Test for candida (yeast) was positive.  Prescription for fluconazole 150mg  po now, repeat dose in 3d if needed, #2 no refills, sent to the pharmacy of record.  Recheck or followup with PCP for further evaluation if symptoms are not improving.    Urine treated with macrobid.  Patient contacted and made aware of all results, all questions answered.

## 2019-02-17 ENCOUNTER — Emergency Department (HOSPITAL_COMMUNITY)
Admission: EM | Admit: 2019-02-17 | Discharge: 2019-02-17 | Disposition: A | Discharge status: Home / Self Care | Payer: BLUE CROSS/BLUE SHIELD | Attending: Emergency Medicine | Admitting: Emergency Medicine

## 2019-02-17 ENCOUNTER — Encounter (HOSPITAL_COMMUNITY): Payer: Self-pay

## 2019-02-17 ENCOUNTER — Other Ambulatory Visit: Payer: Self-pay

## 2019-02-17 ENCOUNTER — Emergency Department (HOSPITAL_COMMUNITY): Payer: BLUE CROSS/BLUE SHIELD

## 2019-02-17 DIAGNOSIS — Z79899 Other long term (current) drug therapy: Secondary | ICD-10-CM | POA: Insufficient documentation

## 2019-02-17 DIAGNOSIS — J069 Acute upper respiratory infection, unspecified: Secondary | ICD-10-CM | POA: Diagnosis not present

## 2019-02-17 DIAGNOSIS — J45909 Unspecified asthma, uncomplicated: Secondary | ICD-10-CM | POA: Insufficient documentation

## 2019-02-17 DIAGNOSIS — R05 Cough: Secondary | ICD-10-CM | POA: Diagnosis present

## 2019-02-17 MED ORDER — AEROCHAMBER PLUS FLO-VU MEDIUM MISC
1.0000 | Freq: Once | Status: AC
  Administered 2019-02-17: 1
  Filled 2019-02-17: qty 1

## 2019-02-17 MED ORDER — ALBUTEROL SULFATE HFA 108 (90 BASE) MCG/ACT IN AERS
10.0000 | INHALATION_SPRAY | Freq: Once | RESPIRATORY_TRACT | Status: AC
  Administered 2019-02-17: 10 via RESPIRATORY_TRACT
  Filled 2019-02-17: qty 6.7

## 2019-02-17 MED ORDER — BENZONATATE 100 MG PO CAPS: 100.0000 mg | ORAL_CAPSULE | Freq: Three times a day (TID) | ORAL | 0 refills | Status: AC

## 2019-02-17 NOTE — ED Provider Notes (Signed)
[facility] Provider Note   CSN: [id number] Arrival date & time: 02/17/19  1450    History   Chief Complaint Chief Complaint  Patient presents with  . Cough    HPI Tara Fry is a 28 y.o. female.     HPI   Pt is a 28 y/o female with a h/o asthma presents to the ED today for evaluation of chest tightness that began about 3 days ago. States she has had a dry cough for the last several days as well.  Denies rhinorrhea, congestion. Does report dry/sore throat. Denies known fevers at home. No body aches or chills.  Denies nausea, vomiting, diarrhea. She reports sob. She did not take her inhaler at home. She does have one.   States that her daughter was recently ill with fevers, abd pain, sore throat, and a cough. She was tested for the flu and strep and those tests were negative.  Denies leg pain/swelling, hemoptysis, recent surgery/trauma, recent long travel, hormone use, personal hx of cancer, or hx of DVT/PE. No recent foreign travel. No known exposures to COVID-19.  Past Medical History:  Diagnosis Date  . Asthma     There are no active problems to display for this patient.   Past Surgical History:  Procedure Laterality Date  . TONSILLECTOMY       OB History   No obstetric history on file.      Home Medications    Prior to Admission medications   Medication Sig Start Date End Date Taking? Authorizing Provider  albuterol (PROVENTIL HFA;VENTOLIN HFA) 108 (90 Base) MCG/ACT inhaler Inhale 1-2 puffs into the lungs every 6 (six) hours as needed for wheezing or shortness of breath.    [provider]  benzonatate (TESSALON) 100 MG capsule Take 1 capsule (100 mg total) by mouth every 8 (eight) hours for 5 days. 02/17/19 02/22/19  Lavon Horn S, PA-C  ibuprofen (ADVIL,MOTRIN) 200 MG tablet Take 400 mg by mouth every 6 (six) hours as needed for moderate pain.    [provider]  NUVARING 0.12-0.015 MG/24HR vaginal  ring Place 1 Device vaginally every 21 ( twenty-one) days. Then remove for 7 days 02/17/18   [provider]    Family History Family History  Problem Relation Age of Onset  . Healthy Mother   . Healthy Father     Social History Social History   Tobacco Use  . Smoking status: Never Smoker  . Smokeless tobacco: Never Used  Substance Use Topics  . Alcohol use: Yes    Comment: socially  . Drug use: Never     Allergies   Patient has no known allergies.   Review of Systems Review of Systems  Constitutional: Negative for fever.  HENT: Positive for sore throat. Negative for ear pain.   Eyes: Negative for visual disturbance.  Respiratory: Positive for cough and shortness of breath.   Cardiovascular: Negative for chest pain and leg swelling.  Gastrointestinal: Negative for abdominal pain, constipation, diarrhea, nausea and vomiting.  Genitourinary: Negative for dysuria and hematuria.  Musculoskeletal: Negative for back pain and myalgias.  Skin: Negative for color change and rash.  Neurological: Negative for headaches.  All other systems reviewed and are negative.   Physical Exam Updated Vital Signs BP 121/66 (BP Location: Left Arm)   Pulse 80   Temp 99.3 F (37.4 C) (Oral)   Resp 18   Ht 5\' 5"  (1.651 m)   Wt 78.7 kg   LMP 02/17/2019  SpO2 100%   BMI 28.87 kg/m   Physical Exam Vitals signs and nursing note reviewed.  Constitutional:      General: She is not in acute distress.    Appearance: She is well-developed. She is not ill-appearing or toxic-appearing.  HENT:     Head: Normocephalic and atraumatic.     Right Ear: Tympanic membrane normal.     Left Ear: Tympanic membrane normal.     Nose: Nose normal.     Mouth/Throat:     Mouth: Mucous membranes are moist.     Pharynx: No oropharyngeal exudate or posterior oropharyngeal erythema.     Comments: Tonsils not present Eyes:     Conjunctiva/sclera: Conjunctivae normal.  Neck:     Musculoskeletal:  Neck supple.  Cardiovascular:     Rate and Rhythm: Normal rate and regular rhythm.     Pulses: Normal pulses.     Heart sounds: Normal heart sounds. No murmur.  Pulmonary:     Effort: Pulmonary effort is normal. No respiratory distress.     Breath sounds: Normal breath sounds. No stridor. No wheezing, rhonchi or rales.  Abdominal:     General: Bowel sounds are normal. There is no distension.     Palpations: Abdomen is soft.     Tenderness: There is no abdominal tenderness. There is no guarding or rebound.  Musculoskeletal:        General: No tenderness.     Right lower leg: No edema.     Left lower leg: No edema.  Skin:    General: Skin is warm and dry.  Neurological:     Mental Status: She is alert.      ED Treatments / Results  Labs (all labs ordered are listed, but only abnormal results are displayed) Labs Reviewed - No data to display  EKG None  Radiology Dg Chest Portable 1 View  Result Date: 02/17/2019 CLINICAL DATA:  Nonproductive cough for several days EXAM: PORTABLE CHEST 1 VIEW COMPARISON:  None. FINDINGS: The heart size and mediastinal contours are within normal limits. Both lungs are clear. The visualized skeletal structures are unremarkable. IMPRESSION: No active disease. Electronically Signed   By: Alcide Clever M.D.   On: 02/17/2019 17:14    Procedures Procedures (including critical care time)  Medications Ordered in ED Medications  albuterol (PROVENTIL HFA;VENTOLIN HFA) 108 (90 Base) MCG/ACT inhaler 10 puff (10 puffs Inhalation Given 02/17/19 1602)  AeroChamber Plus Flo-Vu Medium MISC 1 each (1 each Other Given 02/17/19 1602)     Initial Impression / Assessment and Plan / ED Course  I have reviewed the triage vital signs and the nursing notes.  Pertinent labs & imaging results that were available during my care of the patient were reviewed by me and considered in my medical decision making (see chart for details).      Final Clinical Impressions(s)  / ED Diagnoses   Final diagnoses:  Upper respiratory tract infection, unspecified type   28 year old female presenting with dry cough and chest tightness.  Does have history of asthma.  Has not tried inhalers at home.  Daughter was recently sick with similar symptoms.  Temp 99 Fahrenheit here.  Otherwise her vital signs are stable.  Satting at 100% on room air.  Lungs clear to auscultation bilaterally.  Patient no distress.  No known contacts with Covid.  No recent foreign travel. Pt is nontoxic appearing. She does not meet current criteria for covid testing. Discussed deferring influenza testing as she would be  outside the window for tamiflu. After albuterol inhaler pt feels improved. Her CXR is negative. She is not tachypneic or hypoxic. I feel she is appropriate for outpt management of likely  [place]. Have advised to self quarantine per current guidelines. Advised to f/u closely with pcp and to return to the ED for new or worsening symptoms. All questions answered. Pt stable for dc.    ED Discharge Orders         Ordered    benzonatate (TESSALON) 100 MG capsule  Every 8 hours     02/17/19 1737           Karrie Meres, PA-C 02/17/19 1829    Raeford Razor, MD 02/17/19 2005

## 2019-02-17 NOTE — ED Triage Notes (Signed)
Patient c/o non productive cough x 3 days. Patient states her daughter has been sick with cough, sore throat, fever, and emesis. Patient denies any traveling or around anyone who travels, but does work at [employer]

## 2019-02-17 NOTE — Discharge Instructions (Addendum)
Please take 2 puffs of the inhaler every 6 hours as needed for cough, shortness of breath, or  chest tightness.   Take other prescribed medications as directed.   Please follow up with your primary care provider within 5-7 days for re-evaluation of your symptoms. If you do not have a primary care provider, information for a healthcare clinic has been provided for you to make arrangements for follow up care. Please return to the emergency department for any new or worsening symptoms.   You should be isolated for at least 7 days since the onset of your symptoms AND >72 hours after symptoms resolution (absence of fever without the use of fever reducing medication and improvement in respiratory symptoms), whichever is longer

## 2019-02-17 NOTE — ED Notes (Signed)
Pt states she has an inhaler but has not used it although reports feeling tight in throat area.

## 2019-05-19 IMAGING — DX PORTABLE CHEST - 1 VIEW
1 series · 1 of 1 positions shown · non-contrast
Comparison: None.

CLINICAL DATA: Nonproductive cough for several days

EXAM:
PORTABLE CHEST 1 VIEW

[chest ap]
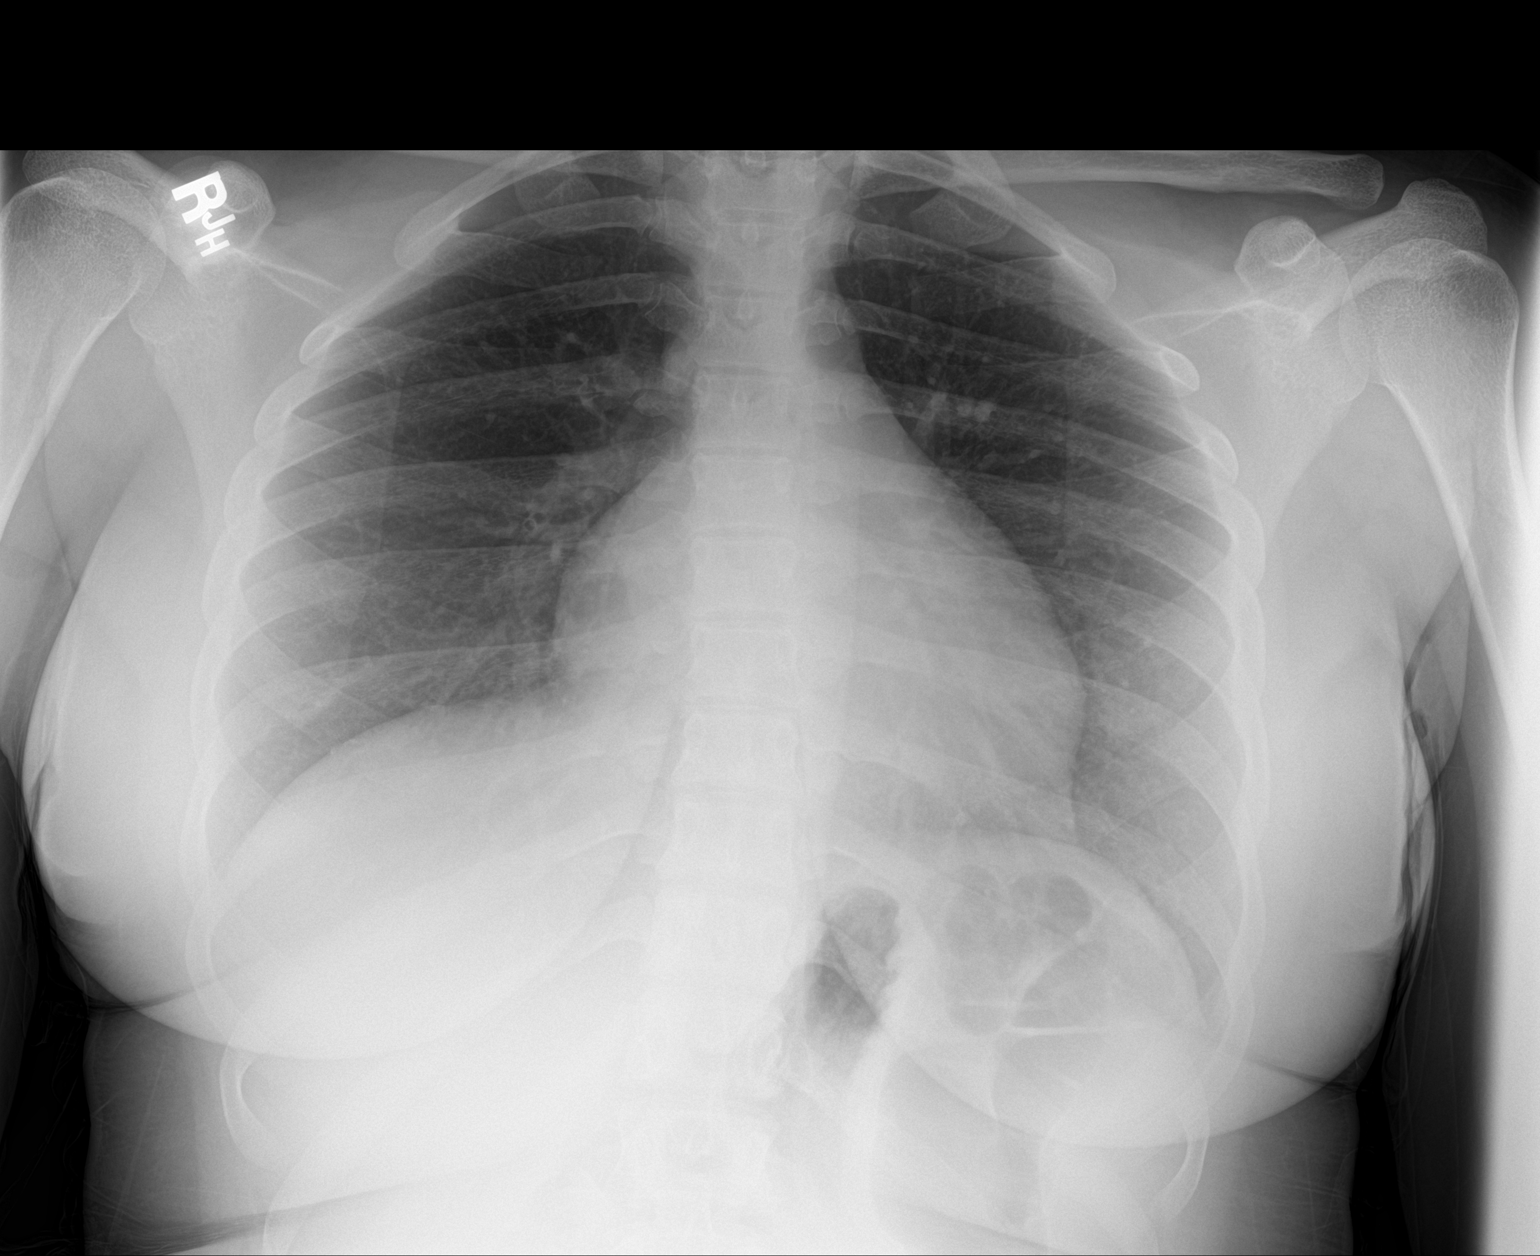

[1 of 1 positions shown; findings below may reference images not displayed]

FINDINGS: The heart size and mediastinal contours are within normal limits.
Both lungs are clear. The visualized skeletal structures are
unremarkable.
IMPRESSION: No active disease.

## 2019-07-13 ENCOUNTER — Other Ambulatory Visit: Payer: Self-pay

## 2019-07-13 DIAGNOSIS — Z20822 Contact with and (suspected) exposure to covid-19: Secondary | ICD-10-CM

## 2019-07-14 LAB — NOVEL CORONAVIRUS, NAA: SARS-CoV-2, NAA: NOT DETECTED

## 2019-08-15 ENCOUNTER — Encounter (HOSPITAL_COMMUNITY): Payer: Self-pay | Admitting: Emergency Medicine

## 2019-08-15 ENCOUNTER — Other Ambulatory Visit: Payer: Self-pay

## 2019-08-15 ENCOUNTER — Ambulatory Visit (HOSPITAL_COMMUNITY)
Admission: EM | Admit: 2019-08-15 | Discharge: 2019-08-15 | Disposition: A | Discharge status: Home / Self Care | Payer: Medicaid Other | Attending: Family Medicine | Admitting: Family Medicine

## 2019-08-15 DIAGNOSIS — N3000 Acute cystitis without hematuria: Secondary | ICD-10-CM | POA: Diagnosis present

## 2019-08-15 DIAGNOSIS — R350 Frequency of micturition: Secondary | ICD-10-CM | POA: Insufficient documentation

## 2019-08-15 DIAGNOSIS — Z3202 Encounter for pregnancy test, result negative: Secondary | ICD-10-CM | POA: Diagnosis not present

## 2019-08-15 LAB — POCT URINALYSIS DIP (DEVICE)
Bilirubin Urine: NEGATIVE
Glucose, UA: NEGATIVE mg/dL
Hgb urine dipstick: NEGATIVE
Ketones, ur: NEGATIVE mg/dL
Leukocytes,Ua: NEGATIVE
Nitrite: POSITIVE — AB
Protein, ur: NEGATIVE mg/dL
Specific Gravity, Urine: >=1.03 (ref 1.005–1.030)
Urobilinogen, UA: 0.2 mg/dL (ref 0.0–1.0)
pH: 5.5 (ref 5.0–8.0)

## 2019-08-15 LAB — POCT PREGNANCY, URINE: Preg Test, Ur: NEGATIVE

## 2019-08-15 MED ORDER — SULFAMETHOXAZOLE-TRIMETHOPRIM 800-160 MG PO TABS: 1.0000 | ORAL_TABLET | Freq: Two times a day (BID) | ORAL | 0 refills | Status: DC

## 2019-08-15 NOTE — ED Provider Notes (Signed)
MRN: [id number] DOB: 1991/08/14  Subjective:   Tara Fry is a 28 y.o. female presenting for 2 to 3-day history of moderate urinary frequency with urgency and malodorous urine.  Patient has a history of UTIs.  Tries to manage these well but admits that she does not hydrate as well as she can.  No current facility-administered medications for this encounter.   Current Outpatient Medications:  .  albuterol (PROVENTIL HFA;VENTOLIN HFA) 108 (90 Base) MCG/ACT inhaler, Inhale 1-2 puffs into the lungs every 6 (six) hours as needed for wheezing or shortness of breath., Disp: , Rfl:  .  ibuprofen (ADVIL,MOTRIN) 200 MG tablet, Take 400 mg by mouth every 6 (six) hours as needed for moderate pain., Disp: , Rfl:  .  NUVARING 0.12-0.015 MG/24HR vaginal ring, Place 1 Device vaginally every 21 ( twenty-one) days. Then remove for 7 days, Disp: , Rfl: 0   No Known Allergies  Past Medical History:  Diagnosis Date  . Asthma      Past Surgical History:  Procedure Laterality Date  . TONSILLECTOMY      Review of Systems  Constitutional: Negative for chills and fever.  Respiratory: Negative for shortness of breath.   Cardiovascular: Negative for chest pain.  Gastrointestinal: Negative for abdominal pain, nausea and vomiting.  Genitourinary: Positive for frequency and urgency. Negative for dysuria, flank pain and hematuria.  Musculoskeletal: Negative for myalgias.  Skin: Negative for rash.  Neurological: Negative for dizziness and headaches.    Objective:   Vitals: BP 106/64 (BP Location: Right Arm)   Pulse 76   Temp 98.7 F (37.1 C) (Oral)   Resp 18   LMP 08/09/2019   SpO2 100%   Physical Exam Constitutional:      General: She is not in acute distress.    Appearance: Normal appearance. She is well-developed. She is not ill-appearing.  HENT:     Head: Normocephalic and atraumatic.     Nose: Nose normal.     Mouth/Throat:     Mouth: Mucous membranes are moist.     Pharynx:  Oropharynx is clear.  Eyes:     General: No scleral icterus.    Extraocular Movements: Extraocular movements intact.     Pupils: Pupils are equal, round, and reactive to light.  Cardiovascular:     Rate and Rhythm: Normal rate.  Pulmonary:     Effort: Pulmonary effort is normal.  Skin:    General: Skin is warm and dry.  Neurological:     General: No focal deficit present.     Mental Status: She is alert and oriented to person, place, and time.  Psychiatric:        Mood and Affect: Mood normal.        Behavior: Behavior normal.     Results for orders placed or performed during the hospital encounter of 08/15/19 (from the past 24 hour(s))  POCT urinalysis dip (device)     Status: Abnormal   Collection Time: 08/15/19  5:34 PM  Result Value Ref Range   Glucose, UA NEGATIVE NEGATIVE mg/dL   Bilirubin Urine NEGATIVE NEGATIVE   Ketones, ur NEGATIVE NEGATIVE mg/dL   Specific Gravity, Urine >=1.030 1.005 - 1.030   Hgb urine dipstick NEGATIVE NEGATIVE   pH 5.5 5.0 - 8.0   Protein, ur NEGATIVE NEGATIVE mg/dL   Urobilinogen, UA 0.2 0.0 - 1.0 mg/dL   Nitrite POSITIVE (A) NEGATIVE   Leukocytes,Ua NEGATIVE NEGATIVE  Pregnancy, urine POC     Status: None  Collection Time: 08/15/19  5:40 PM  Result Value Ref Range   Preg Test, Ur NEGATIVE NEGATIVE    Assessment and Plan :   1. Acute cystitis without hematuria   2. Urinary frequency     Start Bactrim to cover for cystitis, urine culture pending. Counseled patient on potential for adverse effects with medications prescribed/recommended today, ER and return-to-clinic precautions discussed, patient verbalized understanding.    Jaynee Eagles, [place] 08/15/19 1827

## 2019-08-15 NOTE — ED Notes (Signed)
POC and UA collected at 1736 by [place] EMT

## 2019-08-15 NOTE — ED Triage Notes (Signed)
Approximately 2 days ago, noticed odor to urine, noticed frequent urination.  History of uti.

## 2019-08-17 LAB — URINE CULTURE: Culture: 80000 — AB

## 2019-08-18 ENCOUNTER — Telehealth (HOSPITAL_COMMUNITY): Payer: Self-pay | Admitting: Emergency Medicine

## 2019-08-18 NOTE — Telephone Encounter (Signed)
Urine culture was positive for  and was given bactrim  at urgent care visit. Attempted to reach patient. No answer at this time.

## 2019-08-19 ENCOUNTER — Encounter: Payer: Self-pay | Admitting: Podiatry

## 2019-08-19 ENCOUNTER — Other Ambulatory Visit: Payer: Self-pay

## 2019-08-19 ENCOUNTER — Ambulatory Visit (INDEPENDENT_AMBULATORY_CARE_PROVIDER_SITE_OTHER): Payer: Medicaid Other

## 2019-08-19 ENCOUNTER — Ambulatory Visit: Payer: Medicaid Other | Admitting: Podiatry

## 2019-08-19 VITALS — BP 111/65 | HR 78 | Resp 20

## 2019-08-19 DIAGNOSIS — M722 Plantar fascial fibromatosis: Secondary | ICD-10-CM | POA: Diagnosis not present

## 2019-08-19 DIAGNOSIS — M79671 Pain in right foot: Secondary | ICD-10-CM

## 2019-08-19 MED ORDER — METHYLPREDNISOLONE 4 MG PO TBPK
ORAL_TABLET | ORAL | 0 refills | Status: DC
Start: 2019-08-19 — End: 2023-10-12

## 2019-08-19 NOTE — Patient Instructions (Signed)

## 2019-08-19 NOTE — Progress Notes (Signed)
Subjective:  Patient ID: Tara Fry, female    DOB: 04/15/1991,  MRN: [id number]  Chief Complaint  Patient presents with  . Foot Pain    NP: "It's a sharp pain in my heels.  I been feeling it about a year.  My left one gives me more trouble.  I bought compression socks to help but it only helped a little bit.  It hurts when I stand up from laying down a while."    28 y.o. female presents with the above complaint.  She states that she has sharp pain in both of her heels the pain has been for about a year patient states that the left forearm gives her more trouble than the right.she has tried compression therapy on both of her feet which has did not help at all   Review of Systems: Negative except as noted in the HPI. Denies N/V/F/Ch.  Past Medical History:  Diagnosis Date  . Asthma     Current Outpatient Medications:  .  albuterol (PROVENTIL HFA;VENTOLIN HFA) 108 (90 Base) MCG/ACT inhaler, Inhale 1-2 puffs into the lungs every 6 (six) hours as needed for wheezing or shortness of breath., Disp: , Rfl:  .  ibuprofen (ADVIL,MOTRIN) 200 MG tablet, Take 400 mg by mouth every 6 (six) hours as needed for moderate pain., Disp: , Rfl:  .  methylPREDNISolone (MEDROL DOSEPAK) 4 MG TBPK tablet, Use as directed, Disp: 1 each, Rfl: 0 .  sulfamethoxazole-trimethoprim (BACTRIM DS) 800-160 MG tablet, Take 1 tablet by mouth 2 (two) times daily., Disp: 10 tablet, Rfl: 0  Social History   Tobacco Use  Smoking Status Current Every Day Smoker  . Packs/day: 1.00  . Types: Cigars  Smokeless Tobacco Never Used    No Known Allergies Objective:   Vitals:   08/19/19 1143  BP: 111/65  Pulse: 78  Resp: 20   There is no height or weight on file to calculate BMI. Constitutional Well developed. Well nourished.  Vascular Dorsalis pedis pulses palpable bilaterally. Posterior tibial pulses palpable bilaterally. Capillary refill normal to all digits.  No cyanosis or clubbing noted. Pedal hair  growth normal.  Neurologic Normal speech. Oriented to person, place, and time. Epicritic sensation to light touch grossly present bilaterally.  Dermatologic Nails well groomed and normal in appearance. No open wounds. No skin lesions.  Orthopedic: Normal joint ROM without pain or crepitus bilaterally. No visible deformities. Tender to palpation at the calcaneal tuber bilaterally. No pain with calcaneal squeeze bilaterally. Ankle ROM diminished range of motion bilaterally. Silfverskiold Test: positive bilaterally.   Radiographs: Taken and reviewed. No acute fractures or dislocations. No evidence of stress fracture.  Plantar heel spur present. Posterior heel spur present.   Assessment:   1. Plantar fasciitis, bilateral   2. Plantar fasciitis of right foot   3. Plantar fasciitis, left   4. Pain in both feet    Plan:  Patient was evaluated and treated and all questions answered.  Plantar Fasciitis, bilaterally - XR reviewed as above.  - Educated on icing and stretching. Instructions given.  - Injection delivered to the plantar fascia as below. - DME: Plantar Fascial Brace x 2 - Pharmacologic management: Meloxicam/Medrol Dose Pak. Educated on risks/benefits and proper taking of medication.  Procedure: Injection Tendon/Ligament Location: Bilateral plantar fascia at the glabrous junction; medial approach. Skin Prep: alcohol Injectate: 0.5 cc 0.5% marcaine plain, 0.5 cc of 1% Lidocaine, 0.5 cc kenalog 10. Disposition: Patient tolerated procedure well. Injection site dressed with a band-aid.  Return  in about 4 weeks (around 09/16/2019).

## 2019-08-22 ENCOUNTER — Encounter: Payer: Self-pay | Admitting: Podiatry

## 2019-09-19 ENCOUNTER — Ambulatory Visit: Payer: Medicaid Other | Admitting: Podiatry

## 2019-11-30 ENCOUNTER — Other Ambulatory Visit: Payer: Self-pay

## 2019-11-30 DIAGNOSIS — Z20822 Contact with and (suspected) exposure to covid-19: Secondary | ICD-10-CM

## 2019-12-01 LAB — NOVEL CORONAVIRUS, NAA: SARS-CoV-2, NAA: NOT DETECTED

## 2019-12-09 ENCOUNTER — Other Ambulatory Visit: Payer: Self-pay

## 2019-12-09 ENCOUNTER — Ambulatory Visit: Payer: Medicaid Other | Admitting: Podiatry

## 2019-12-09 ENCOUNTER — Encounter: Payer: Self-pay | Admitting: Podiatry

## 2019-12-09 DIAGNOSIS — M722 Plantar fascial fibromatosis: Secondary | ICD-10-CM

## 2019-12-09 DIAGNOSIS — M79672 Pain in left foot: Secondary | ICD-10-CM

## 2019-12-09 DIAGNOSIS — M7731 Calcaneal spur, right foot: Secondary | ICD-10-CM

## 2019-12-09 DIAGNOSIS — M79671 Pain in right foot: Secondary | ICD-10-CM

## 2019-12-09 NOTE — Progress Notes (Signed)
Subjective:  Patient ID: Tara Fry, female    DOB: [birth year],  MRN: [id number]  Chief Complaint  Patient presents with  . Foot Pain    pt is here for a f/u on bil plantar fasciitis, pt states that she is not feeling better since the last time she was here, the injection she recieved last time has not helped, pt also states that both of her foot pain is elevated in the morning, or when trying to get up. pt states that the right pain is worse than the left    29 y.o. female presents with the above complaint.  Agree with the above except patient states her left plantar fasciitis pain has completely resolved she does not have any pain on the heel.  However her right plantar fasciitis pain has mildly decreased but she still has pretty good amount of pain to the right side.  She tried the plantar fascial brace which has helped a little bit.  She states her pain is about 8 out of 10.  Pain is elevated in the morning and try to get up.  She denies any other acute complaints.   Review of Systems: Negative except as noted in the HPI. Denies N/V/F/Ch.  Past Medical History:  Diagnosis Date  . Asthma     Current Outpatient Medications:  .  albuterol (PROVENTIL HFA;VENTOLIN HFA) 108 (90 Base) MCG/ACT inhaler, Inhale 1-2 puffs into the lungs every 6 (six) hours as needed for wheezing or shortness of breath., Disp: , Rfl:  .  ibuprofen (ADVIL,MOTRIN) 200 MG tablet, Take 400 mg by mouth every 6 (six) hours as needed for moderate pain., Disp: , Rfl:  .  methylPREDNISolone (MEDROL DOSEPAK) 4 MG TBPK tablet, Use as directed, Disp: 1 each, Rfl: 0 .  sulfamethoxazole-trimethoprim (BACTRIM DS) 800-160 MG tablet, Take 1 tablet by mouth 2 (two) times daily., Disp: 10 tablet, Rfl: 0  Social History   Tobacco Use  Smoking Status Current Every Day Smoker  . Packs/day: 1.00  . Types: Cigars  Smokeless Tobacco Never Used    No Known Allergies Objective:   There were no vitals filed for this  visit. There is no height or weight on file to calculate BMI. Constitutional Well developed. Well nourished.  Vascular Dorsalis pedis pulses palpable bilaterally. Posterior tibial pulses palpable bilaterally. Capillary refill normal to all digits.  No cyanosis or clubbing noted. Pedal hair growth normal.  Neurologic Normal speech. Oriented to person, place, and time. Epicritic sensation to light touch grossly present bilaterally.  Dermatologic Nails well groomed and normal in appearance. No open wounds. No skin lesions.  Orthopedic: Normal joint ROM without pain or crepitus bilaterally. No visible deformities. Tender to palpation at the calcaneal tuber left skin No pain with calcaneal squeeze bilaterally. Ankle ROM diminished range of motion bilaterally. Silfverskiold Test: positive bilaterally    Radiographs: None  Assessment:   1. Plantar fasciitis of right foot   2. Plantar fasciitis, left   3. Pain in both feet    Plan:  Patient was evaluated and treated and all questions answered.  Left plantar fasciitis -Resolved  Plantar Fasciitis, right - XR reviewed as above.  - Re-Educated on icing and stretching. Instructions given.  - Injection delivered to the plantar fascia as below. - DME: None - Pharmacologic management: None  -Explained to the patient if her pain does not give resolved on the right plantar fasciitis during next visit I believe patient will benefit for a cam boot immobilization.  Patient  agrees with the plan.  Procedure: Injection Tendon/Ligament Location: Rightplantar fascia at the glabrous junction; medial approach. Skin Prep: alcohol Injectate: 0.5 cc 0.5% marcaine plain, 0.5 cc of 1% Lidocaine, 0.5 cc kenalog 10. Disposition: Patient tolerated procedure well. Injection site dressed with a band-aid.  No follow-ups on file.

## 2019-12-12 ENCOUNTER — Ambulatory Visit: Payer: Medicaid Other | Admitting: Podiatry

## 2020-01-06 ENCOUNTER — Ambulatory Visit: Payer: Medicaid Other | Admitting: Podiatry

## 2020-01-06 ENCOUNTER — Other Ambulatory Visit: Payer: Self-pay

## 2020-01-06 DIAGNOSIS — M722 Plantar fascial fibromatosis: Secondary | ICD-10-CM

## 2020-01-06 DIAGNOSIS — M79671 Pain in right foot: Secondary | ICD-10-CM

## 2020-01-06 DIAGNOSIS — Z01818 Encounter for other preprocedural examination: Secondary | ICD-10-CM | POA: Diagnosis not present

## 2020-01-06 DIAGNOSIS — M79672 Pain in left foot: Secondary | ICD-10-CM | POA: Diagnosis not present

## 2020-01-06 NOTE — Patient Instructions (Signed)
Pre-Operative Instructions  Congratulations, you have decided to take an important step towards improving your quality of life.  You can be assured that the doctors and staff at [facility] will be with you every step of the way.  Here are some important things you should know:  1. Plan to be at the surgery center/hospital at least 1 (one) hour prior to your scheduled time, unless otherwise directed by the surgical center/hospital staff.  You must have a responsible adult accompany you, remain during the surgery and drive you home.  Make sure you have directions to the surgical center/hospital to ensure you arrive on time. 2. If you are having surgery at [facility] or [facility] hospitals, you will need a copy of your medical history and physical form from your family physician within one month prior to the date of surgery. We will give you a form for your primary physician to complete.  3. We make every effort to accommodate the date you request for surgery.  However, there are times where surgery dates or times have to be moved.  We will contact you as soon as possible if a change in schedule is required.   4. No aspirin/ibuprofen for one week before surgery.  If you are on aspirin, any non-steroidal anti-inflammatory medications (Mobic, Aleve, Ibuprofen) should not be taken seven (7) days prior to your surgery.  You make take Tylenol for pain prior to surgery.  5. Medications - If you are taking daily heart and blood pressure medications, seizure, reflux, allergy, asthma, anxiety, pain or diabetes medications, make sure you notify the surgery center/hospital before the day of surgery so they can tell you which medications you should take or avoid the day of surgery. 6. No food or drink after midnight the night before surgery unless directed otherwise by surgical center/hospital staff. 7. No alcoholic beverages 24-hours prior to surgery.  No smoking 24-hours prior or 24-hours after  surgery. 8. Wear loose pants or shorts. They should be loose enough to fit over bandages, boots, and casts. 9. Don't wear slip-on shoes. Sneakers are preferred. 10. Bring your boot with you to the surgery center/hospital.  Also bring crutches or a walker if your physician has prescribed it for you.  If you do not have this equipment, it will be provided for you after surgery. 11. If you have not been contacted by the surgery center/hospital by the day before your surgery, call to confirm the date and time of your surgery. 12. Leave-time from work may vary depending on the type of surgery you have.  Appropriate arrangements should be made prior to surgery with your employer. 13. Prescriptions will be provided immediately following surgery by your doctor.  Fill these as soon as possible after surgery and take the medication as directed. Pain medications will not be refilled on weekends and must be approved by the doctor. 14. Remove nail polish on the operative foot and avoid getting pedicures prior to surgery. 15. Wash the night before surgery.  The night before surgery wash the foot and leg well with water and the antibacterial soap provided. Be sure to pay special attention to beneath the toenails and in between the toes.  Wash for at least three (3) minutes. Rinse thoroughly with water and dry well with a towel.  Perform this wash unless told not to do so by your physician.  Enclosed: 1 Ice pack (please put in freezer the night before surgery)   1 Hibiclens skin cleaner     Pre-op instructions  If you have any questions regarding the instructions, please do not hesitate to call our office.  [place]: [address], [place], [place] [address] -- [phone]  [place]: [address] [place], [place] [address] -- [phone]  [place]: [address], [place], [place] [address] -- [phone]   Website: [email or web address] 

## 2020-01-09 ENCOUNTER — Encounter: Payer: Self-pay | Admitting: Podiatry

## 2020-01-09 NOTE — Progress Notes (Signed)
Subjective:  Patient ID: Tara Fry, female    DOB: 1991/01/21,  MRN: [id number]  Chief Complaint  Patient presents with  . Foot Pain    pt is here for a f/u of plantar fasciitis of both feet, pt states that her left foot is feeling a lot better, but is more concerned of the right foot not healing, pt has tried wearing the cam boot, as well as using the injection but states that she is still in pain, pt puts pain scale as a 8 out of 22.     29 y.o. female presents with the above complaint.  Patient is here on a follow-up of the right plantar fasciitis.  She states that the left side has completely healed without any pain.  However the right side is not responding the same.  Patient has tried injections multiple as well as various offloading braces but none of the pain has decreased.  She states that she continues to do stretching exercises she has immobilized the foot in the cam boot but has not helped at all.  Today she is ambulating with a cam boot on but her pain has been about the same.  She denies any other acute complaints at this time given that she has failed all conservative therapy she would like to discuss surgical intervention.  Her pain scale is 8 out of 10.  She has been ambulating in regular sneakers.   Review of Systems: Negative except as noted in the HPI. Denies N/V/F/Ch.  Past Medical History:  Diagnosis Date  . Asthma     Current Outpatient Medications:  .  albuterol (PROVENTIL HFA;VENTOLIN HFA) 108 (90 Base) MCG/ACT inhaler, Inhale 1-2 puffs into the lungs every 6 (six) hours as needed for wheezing or shortness of breath., Disp: , Rfl:  .  ibuprofen (ADVIL,MOTRIN) 200 MG tablet, Take 400 mg by mouth every 6 (six) hours as needed for moderate pain., Disp: , Rfl:  .  methylPREDNISolone (MEDROL DOSEPAK) 4 MG TBPK tablet, Use as directed, Disp: 1 each, Rfl: 0 .  sulfamethoxazole-trimethoprim (BACTRIM DS) 800-160 MG tablet, Take 1 tablet by mouth 2 (two) times daily.,  Disp: 10 tablet, Rfl: 0  Social History   Tobacco Use  Smoking Status Current Every Day Smoker  . Packs/day: 1.00  . Types: Cigars  Smokeless Tobacco Never Used    No Known Allergies Objective:   There were no vitals filed for this visit. There is no height or weight on file to calculate BMI. Constitutional Well developed. Well nourished.  Vascular Dorsalis pedis pulses palpable bilaterally. Posterior tibial pulses palpable bilaterally. Capillary refill normal to all digits.  No cyanosis or clubbing noted. Pedal hair growth normal.  Neurologic Normal speech. Oriented to person, place, and time. Epicritic sensation to light touch grossly present bilaterally.  Dermatologic Nails well groomed and normal in appearance. No open wounds. No skin lesions.  Orthopedic: Normal joint ROM without pain or crepitus bilaterally. No visible deformities. Tender to palpation at the calcaneal tuber left skin No pain with calcaneal squeeze bilaterally. Ankle ROM diminished range of motion bilaterally. Silfverskiold Test: positive bilaterally    Radiographs: None  Assessment:   1. Plantar fasciitis of right foot   2. Pain in both feet   3. Preop examination    Plan:  Patient was evaluated and treated and all questions answered.  Left plantar fasciitis -Resolved  Plantar Fasciitis, right -I discussed all my radiographic findings as well as my clinical findings with the patient.  Given that she has had no significant improvement on the right side in the setting of failing all conservative therapy, I believe patient will benefit from right endoscopic plantar fasciotomy with gastrocnemius recession.  Patient states understanding would like to proceed with a surgical intervention at this time given she has via failed all conservative therapy. -I have discussed all my postoperative management with the patient including weightbearing as tolerated with a cam boot after the surgery for 3 weeks  until the sutures are ready to come out.  Patient states understanding and will continue to use a cam boot after the surgery -Informed surgical risk consent was reviewed and read aloud to the patient.  I reviewed the films.  I have discussed my findings with the patient in great detail.  I have discussed all risks including but not limited to infection, stiffness, scarring, limp, disability, deformity, damage to blood vessels and nerves, numbness, poor healing, need for braces, arthritis, chronic pain, amputation, death.  All benefits and realistic expectations discussed in great detail.  I have made no promises as to the outcome.  I have provided realistic expectations.  I have offered the patient a 2nd opinion, which they have declined and assured me they preferred to proceed despite the risks   No follow-ups on file.

## 2020-01-23 DIAGNOSIS — M216X1 Other acquired deformities of right foot: Secondary | ICD-10-CM | POA: Diagnosis not present

## 2020-01-23 DIAGNOSIS — M722 Plantar fascial fibromatosis: Secondary | ICD-10-CM

## 2020-01-23 MED ORDER — OXYCODONE-ACETAMINOPHEN 10-325 MG PO TABS: 1.0000 | ORAL_TABLET | Freq: Four times a day (QID) | ORAL | 0 refills | Status: AC | PRN

## 2020-01-23 MED ORDER — IBUPROFEN 200 MG PO TABS: 400.0000 mg | ORAL_TABLET | Freq: Four times a day (QID) | ORAL | 1 refills | Status: DC | PRN

## 2020-01-23 NOTE — Addendum Note (Signed)
Addended by: Nicholes Rough on: 01/23/2020 07:48 AM   Modules accepted: Orders

## 2020-01-27 ENCOUNTER — Telehealth: Payer: Self-pay | Admitting: Podiatry

## 2020-01-27 NOTE — Telephone Encounter (Signed)
Pt had surgery on 01/23/20 and believes her bandages might be too tight and would like to know if she is able to unwrap the bandages. Please give patient a call

## 2020-01-27 NOTE — Telephone Encounter (Signed)
I spoke with pt and informed that she could remove the boot, open-ended sock, ace wrap only and elevate the foot for 15 minute, then place the foot level with her hip and beginning at the toes rewrap the ace rolling loosely down the foot and up the leg, replace the sock and boot.

## 2020-02-01 ENCOUNTER — Ambulatory Visit (INDEPENDENT_AMBULATORY_CARE_PROVIDER_SITE_OTHER): Payer: Medicaid Other | Admitting: Podiatry

## 2020-02-01 ENCOUNTER — Other Ambulatory Visit: Payer: Self-pay

## 2020-02-01 DIAGNOSIS — M722 Plantar fascial fibromatosis: Secondary | ICD-10-CM

## 2020-02-01 DIAGNOSIS — M79672 Pain in left foot: Secondary | ICD-10-CM

## 2020-02-01 DIAGNOSIS — Z9889 Other specified postprocedural states: Secondary | ICD-10-CM

## 2020-02-01 DIAGNOSIS — M79671 Pain in right foot: Secondary | ICD-10-CM

## 2020-02-02 ENCOUNTER — Encounter: Payer: Self-pay | Admitting: Podiatry

## 2020-02-02 NOTE — Progress Notes (Signed)
  Subjective:  Patient ID: Tara Fry, female    DOB: 02-03-1991,  MRN: [id number]  Chief Complaint  Patient presents with  . Routine Post Op    POV #1 DOS 01/23/2020 EPF RT, GASTROCNEMIUS RT, pt states that the right foot is not painful, but more of an uncomfortable sensation.      29 y.o. female returns for post-op check.  Patient presents today after undergoing the listed procedure above.  Patient states that she is doing really well.  She does not have any pain but has uncomfortable feeling.  There is some bruising in the calf which is expected.  Patient may wear the cam boot ambulating as tolerated patient has been weaning off the pain medication.  She denies any other acute complaints.  Review of Systems: Negative except as noted in the HPI. Denies N/V/F/Ch.  Past Medical History:  Diagnosis Date  . Asthma     Current Outpatient Medications:  .  albuterol (PROVENTIL HFA;VENTOLIN HFA) 108 (90 Base) MCG/ACT inhaler, Inhale 1-2 puffs into the lungs every 6 (six) hours as needed for wheezing or shortness of breath., Disp: , Rfl:  .  ibuprofen (ADVIL) 200 MG tablet, Take 2 tablets (400 mg total) by mouth every 6 (six) hours as needed for moderate pain., Disp: 30 tablet, Rfl: 1 .  methylPREDNISolone (MEDROL DOSEPAK) 4 MG TBPK tablet, Use as directed, Disp: 1 each, Rfl: 0 .  sulfamethoxazole-trimethoprim (BACTRIM DS) 800-160 MG tablet, Take 1 tablet by mouth 2 (two) times daily., Disp: 10 tablet, Rfl: 0  Social History   Tobacco Use  Smoking Status Current Every Day Smoker  . Packs/day: 1.00  . Types: Cigars  Smokeless Tobacco Never Used    No Known Allergies Objective:  There were no vitals filed for this visit. There is no height or weight on file to calculate BMI. Constitutional Well developed. Well nourished.  Vascular Foot warm and well perfused. Capillary refill normal to all digits.   Neurologic Normal speech. Oriented to person, place, and time. Epicritic  sensation to light touch grossly present bilaterally.  Dermatologic Skin healing well without signs of infection. Skin edges well coapted without signs of infection.  Orthopedic: Tenderness to palpation noted about the surgical site.   Radiographs: None Assessment:  No diagnosis found. Plan:  Patient was evaluated and treated and all questions answered.  S/p foot surgery right -Progressing as expected post-operatively. -XR: None -WB Status: Weightbearing as tolerated in cam boot -Sutures: Intact.  Without any signs of dehiscence.  I plan on removing the stitches during next visit. -Medications: None -Foot redressed.  No follow-ups on file.

## 2020-02-08 ENCOUNTER — Other Ambulatory Visit: Payer: Self-pay

## 2020-02-08 ENCOUNTER — Ambulatory Visit (INDEPENDENT_AMBULATORY_CARE_PROVIDER_SITE_OTHER): Payer: Medicaid Other | Admitting: Podiatry

## 2020-02-08 ENCOUNTER — Encounter: Payer: Self-pay | Admitting: Podiatry

## 2020-02-08 DIAGNOSIS — Z9889 Other specified postprocedural states: Secondary | ICD-10-CM

## 2020-02-08 DIAGNOSIS — M722 Plantar fascial fibromatosis: Secondary | ICD-10-CM

## 2020-02-08 NOTE — Progress Notes (Signed)
  Subjective:  Patient ID: Tara Fry, female    DOB: [birth year],  MRN: [id number]  Chief Complaint  Patient presents with  . Routine Post Op    DOS 3.1.2021 EPF RT, GASTROCNEMIUS RT. Pt states no concerns, denies constitutional symptoms.     29 y.o. female returns for post-op check.  She is doing well overall.  She does not have much pain but her pain is well controlled.  She has been ambulating with regular sneakers without any acute problems.  She denies any other clinical signs of infection.  Review of Systems: Negative except as noted in the HPI. Denies N/V/F/Ch.  Past Medical History:  Diagnosis Date  . Asthma     Current Outpatient Medications:  .  albuterol (PROVENTIL HFA;VENTOLIN HFA) 108 (90 Base) MCG/ACT inhaler, Inhale 1-2 puffs into the lungs every 6 (six) hours as needed for wheezing or shortness of breath., Disp: , Rfl:  .  ibuprofen (ADVIL) 200 MG tablet, Take 2 tablets (400 mg total) by mouth every 6 (six) hours as needed for moderate pain., Disp: 30 tablet, Rfl: 1 .  methylPREDNISolone (MEDROL DOSEPAK) 4 MG TBPK tablet, Use as directed, Disp: 1 each, Rfl: 0 .  sulfamethoxazole-trimethoprim (BACTRIM DS) 800-160 MG tablet, Take 1 tablet by mouth 2 (two) times daily., Disp: 10 tablet, Rfl: 0  Social History   Tobacco Use  Smoking Status Current Every Day Smoker  . Packs/day: 1.00  . Types: Cigars  Smokeless Tobacco Never Used    No Known Allergies Objective:  There were no vitals filed for this visit. There is no height or weight on file to calculate BMI. Constitutional Well developed. Well nourished.  Vascular Foot warm and well perfused. Capillary refill normal to all digits.   Neurologic Normal speech. Oriented to person, place, and time. Epicritic sensation to light touch grossly present bilaterally.  Dermatologic Skin healing well without signs of infection. Skin edges well coapted without signs of infection.  Orthopedic: Tenderness to palpation  noted about the surgical site.   Radiographs: None Assessment:  No diagnosis found. Plan:  Patient was evaluated and treated and all questions answered.  S/p foot surgery right -Progressing as expected post-operatively. -XR: None -WB Status: Weightbearing as tolerated in cam boot can slowly start transitioning to regular sneakers next week -Sutures: Intact.  Patient is oriented.  Not reassessed.  No clinical signs of infection noted.  -Foot redressed. -Patient can start getting it wet starting 1 week from today.  Patient states understanding.  No follow-ups on file.

## 2020-02-22 ENCOUNTER — Other Ambulatory Visit: Payer: Self-pay

## 2020-02-22 ENCOUNTER — Encounter: Payer: Self-pay | Admitting: Podiatry

## 2020-02-22 ENCOUNTER — Ambulatory Visit (INDEPENDENT_AMBULATORY_CARE_PROVIDER_SITE_OTHER): Payer: Medicaid Other | Admitting: Podiatry

## 2020-02-22 DIAGNOSIS — Z9889 Other specified postprocedural states: Secondary | ICD-10-CM

## 2020-02-22 DIAGNOSIS — M722 Plantar fascial fibromatosis: Secondary | ICD-10-CM

## 2020-02-22 NOTE — Progress Notes (Signed)
  Subjective:  Patient ID: Tara Fry, female    DOB: [birth year],  MRN: [id number]  Chief Complaint  Patient presents with  . Routine Post Op     POV #3 DOS 01/23/2020 EPF RT, GASTROCNEMIUS RT, pt has no pain, pt also states she is looking to get her foot wet.     29 y.o. female returns for post-op check.  She is doing well overall.  She states that she does not have pain anymore.  She states that she out of the boot and start washing her foot she denies any other acute complaints. Review of Systems: Negative except as noted in the HPI. Denies N/V/F/Ch.  Past Medical History:  Diagnosis Date  . Asthma     Current Outpatient Medications:  .  albuterol (PROVENTIL HFA;VENTOLIN HFA) 108 (90 Base) MCG/ACT inhaler, Inhale 1-2 puffs into the lungs every 6 (six) hours as needed for wheezing or shortness of breath., Disp: , Rfl:  .  ibuprofen (ADVIL) 200 MG tablet, Take 2 tablets (400 mg total) by mouth every 6 (six) hours as needed for moderate pain., Disp: 30 tablet, Rfl: 1 .  methylPREDNISolone (MEDROL DOSEPAK) 4 MG TBPK tablet, Use as directed, Disp: 1 each, Rfl: 0 .  sulfamethoxazole-trimethoprim (BACTRIM DS) 800-160 MG tablet, Take 1 tablet by mouth 2 (two) times daily., Disp: 10 tablet, Rfl: 0  Social History   Tobacco Use  Smoking Status Current Every Day Smoker  . Packs/day: 1.00  . Types: Cigars  Smokeless Tobacco Never Used    No Known Allergies Objective:  There were no vitals filed for this visit. There is no height or weight on file to calculate BMI. Constitutional Well developed. Well nourished.  Vascular Foot warm and well perfused. Capillary refill normal to all digits.   Neurologic Normal speech. Oriented to person, place, and time. Epicritic sensation to light touch grossly present bilaterally.  Dermatologic  skin completely reepithelialized.  No clinical signs of infection noted.  Orthopedic: No Tenderness to palpation noted about the surgical site.    Radiographs: None Assessment:   1. Plantar fasciitis of right foot   2. Status post foot surgery    Plan:  Patient was evaluated and treated and all questions answered.  S/p foot surgery right -Progressing as expected post-operatively. -XR: None -WB Status: Transition to regular sneakers -Sutures: Intact.  Patient is oriented.  Not reassessed.  No clinical signs of infection noted.  -Foot redressed. -Given the patient has clinically improved she can start beginning to try to gradually transition to a new balance A6 of [employer] and out of the cam boot.  I discussed transition.  As well as break-in period with the patient in great detail.  She denies any other acute complaints and she will start transitioning.  Return in about 4 weeks (around 03/21/2020).

## 2020-03-23 ENCOUNTER — Ambulatory Visit: Payer: Medicaid Other | Admitting: Podiatry

## 2020-03-23 ENCOUNTER — Other Ambulatory Visit: Payer: Self-pay

## 2020-09-21 ENCOUNTER — Other Ambulatory Visit: Payer: Self-pay

## 2020-09-21 ENCOUNTER — Encounter: Payer: Self-pay | Admitting: Podiatry

## 2020-09-21 ENCOUNTER — Ambulatory Visit (INDEPENDENT_AMBULATORY_CARE_PROVIDER_SITE_OTHER): Payer: Medicaid Other | Admitting: Podiatry

## 2020-09-21 DIAGNOSIS — M722 Plantar fascial fibromatosis: Secondary | ICD-10-CM

## 2020-09-21 DIAGNOSIS — M7732 Calcaneal spur, left foot: Secondary | ICD-10-CM

## 2020-09-21 NOTE — Patient Instructions (Signed)

## 2020-09-21 NOTE — Progress Notes (Signed)
Subjective:  Patient ID: Tara Fry, female    DOB: 1991/06/27,  MRN: [id number]  Chief Complaint  Patient presents with  . Numbness    PT stated that she had surgery back in march and is still having numbness and her foot is still sore     29 y.o. female presents with the above complaint.  Patient presents with complaint of left heel pain that started over the last few months.  Patient states the right side which is where I did an operation with an endoscopic plantar fasciotomy with gastrocnemius release.  Patient states overall she is doing little bit better there.  She notices occasional pain to the right side.  However the left side has started acting back up again.  She states it hurts to get out of bed and take the first.  She would like to know if there is any other treatment options that are available to her.  She denies any other acute complaints.  She has done some stretching exercises which seems to help.  She has not tried any other treatment options   Review of Systems: Negative except as noted in the HPI. Denies N/V/F/Ch.  Past Medical History:  Diagnosis Date  . Asthma     Current Outpatient Medications:  .  albuterol (PROVENTIL HFA;VENTOLIN HFA) 108 (90 Base) MCG/ACT inhaler, Inhale 1-2 puffs into the lungs every 6 (six) hours as needed for wheezing or shortness of breath., Disp: , Rfl:  .  COVID-19 Specimen Collection KIT, SWAB AS DIRECTED, Disp: , Rfl:  .  ibuprofen (ADVIL) 200 MG tablet, Take 2 tablets (400 mg total) by mouth every 6 (six) hours as needed for moderate pain., Disp: 30 tablet, Rfl: 1 .  methylPREDNISolone (MEDROL DOSEPAK) 4 MG TBPK tablet, Use as directed, Disp: 1 each, Rfl: 0 .  prednisoLONE acetate (PRED FORTE) 1 % ophthalmic suspension, SMARTSIG:In Eye(s), Disp: , Rfl:  .  RESTASIS 0.05 % ophthalmic emulsion, 1 drop 2 (two) times daily., Disp: , Rfl:  .  sulfamethoxazole-trimethoprim (BACTRIM DS) 800-160 MG tablet, Take 1 tablet by mouth 2 (two)  times daily., Disp: 10 tablet, Rfl: 0  Social History   Tobacco Use  Smoking Status Current Every Day Smoker  . Packs/day: 1.00  . Types: Cigars  Smokeless Tobacco Never Used    No Known Allergies Objective:  There were no vitals filed for this visit. There is no height or weight on file to calculate BMI. Constitutional Well developed. Well nourished.  Vascular Dorsalis pedis pulses palpable bilaterally. Posterior tibial pulses palpable bilaterally. Capillary refill normal to all digits.  No cyanosis or clubbing noted. Pedal hair growth normal.  Neurologic Normal speech. Oriented to person, place, and time. Epicritic sensation to light touch grossly present bilaterally.  Dermatologic Nails well groomed and normal in appearance. No open wounds. No skin lesions.  Orthopedic: Normal joint ROM without pain or crepitus bilaterally. No visible deformities. Tender to palpation at the calcaneal tuber left. No pain with calcaneal squeeze left. Ankle ROM diminished range of motion left. Silfverskiold Test: positive left.   Radiographs: None  Assessment:   1. Plantar fasciitis of left foot   2. Heel spur, left    Plan:  Patient was evaluated and treated and all questions answered.  Plantar Fasciitis, left - XR reviewed as above.  - Educated on icing and stretching. Instructions given.  - Injection delivered to the plantar fascia as below. - DME: Plantar Fascial Brace - Pharmacologic management: None  Procedure: Injection Tendon/Ligament  Location: Left plantar fascia at the glabrous junction; medial approach. Skin Prep: alcohol Injectate: 0.5 cc 0.5% marcaine plain, 0.5 cc of 1% Lidocaine, 0.5 cc kenalog 10. Disposition: Patient tolerated procedure well. Injection site dressed with a band-aid.  No follow-ups on file.

## 2020-10-24 ENCOUNTER — Ambulatory Visit: Payer: Medicaid Other | Admitting: Podiatry

## 2020-11-24 HISTORY — PX: LIPOSUCTION: SHX10

## 2021-01-01 ENCOUNTER — Other Ambulatory Visit: Payer: Self-pay

## 2021-01-01 ENCOUNTER — Encounter (HOSPITAL_COMMUNITY): Payer: Self-pay | Admitting: Emergency Medicine

## 2021-01-01 ENCOUNTER — Ambulatory Visit (HOSPITAL_COMMUNITY)
Admission: EM | Admit: 2021-01-01 | Discharge: 2021-01-01 | Disposition: A | Discharge status: Home / Self Care | Payer: Medicaid Other | Attending: Emergency Medicine | Admitting: Emergency Medicine

## 2021-01-01 DIAGNOSIS — R1030 Lower abdominal pain, unspecified: Secondary | ICD-10-CM | POA: Diagnosis not present

## 2021-01-01 DIAGNOSIS — Z3202 Encounter for pregnancy test, result negative: Secondary | ICD-10-CM | POA: Diagnosis not present

## 2021-01-01 DIAGNOSIS — R8290 Unspecified abnormal findings in urine: Secondary | ICD-10-CM

## 2021-01-01 DIAGNOSIS — K5900 Constipation, unspecified: Secondary | ICD-10-CM

## 2021-01-01 LAB — POCT URINALYSIS DIPSTICK, ED / UC
Bilirubin Urine: NEGATIVE
Glucose, UA: NEGATIVE mg/dL
Hgb urine dipstick: NEGATIVE
Ketones, ur: NEGATIVE mg/dL
Leukocytes,Ua: NEGATIVE
Nitrite: NEGATIVE
Protein, ur: NEGATIVE mg/dL
Specific Gravity, Urine: 1.015 (ref 1.005–1.030)
Urobilinogen, UA: 0.2 mg/dL (ref 0.0–1.0)
pH: 7 (ref 5.0–8.0)

## 2021-01-01 LAB — POC URINE PREG, ED: Preg Test, Ur: NEGATIVE

## 2021-01-01 NOTE — Discharge Instructions (Signed)
Take Colace and MiraLAX as directed.    Go to the emergency department if you have acute abdominal pain or other concerning symptoms.    Your urine is normal.  Your pregnancy test is negative.    Follow up with your primary care provider if your symptoms are not improving.

## 2021-01-01 NOTE — ED Triage Notes (Signed)
Pt presents with lower abdominal pain and odor to urine xs 1 week. States has hx of constipation.

## 2021-01-01 NOTE — ED Provider Notes (Signed)
[facility]    CSN: [id number] Arrival date & time: 01/01/21  1448      History   Chief Complaint Chief Complaint  Patient presents with  . Abdominal Pain    HPI Tara Fry is a 30 y.o. female.   Patient presents with lower abdominal pain and malodorous urine x1 week.  She also states she has a history of constipation and has not had a bowel movement in 3 days.  She denies fever, chills, vomiting, diarrhea, dysuria, hematuria, vaginal discharge, pelvic pain, or other symptoms.  Treatment attempted at home with herbal tea this morning.  Her medical history includes asthma; current everyday smoker.  The history is provided by the patient and medical records.    Past Medical History:  Diagnosis Date  . Asthma     There are no problems to display for this patient.   Past Surgical History:  Procedure Laterality Date  . TONSILLECTOMY      OB History   No obstetric history on file.      Home Medications    Prior to Admission medications   Medication Sig Start Date End Date Taking? Authorizing Provider  albuterol (PROVENTIL HFA;VENTOLIN HFA) 108 (90 Base) MCG/ACT inhaler Inhale 1-2 puffs into the lungs every 6 (six) hours as needed for wheezing or shortness of breath.    [provider]  COVID-19 Specimen Collection KIT SWAB AS DIRECTED 07/22/20   [provider]  ibuprofen (ADVIL) 200 MG tablet Take 2 tablets (400 mg total) by mouth every 6 (six) hours as needed for moderate pain. 01/23/20   Felipa Furnace, DPM  methylPREDNISolone (MEDROL DOSEPAK) 4 MG TBPK tablet Use as directed 08/19/19   Felipa Furnace, DPM  prednisoLONE acetate (PRED FORTE) 1 % ophthalmic suspension SMARTSIG:In Eye(s) 05/10/20   [provider]  RESTASIS 0.05 % ophthalmic emulsion 1 drop 2 (two) times daily. 05/10/20   [provider]  sulfamethoxazole-trimethoprim (BACTRIM DS) 800-160 MG tablet Take 1 tablet by mouth 2 (two) times daily. 08/15/19   Jaynee Eagles, PA-C  NUVARING 0.12-0.015 MG/24HR vaginal ring Place 1 Device vaginally every 21 ( twenty-one) days. Then remove for 7 days 02/17/18 08/15/19  [provider]    Family History Family History  Problem Relation Age of Onset  . Healthy Mother   . Healthy Father     Social History Social History   Tobacco Use  . Smoking status: Current Every Day Smoker    Packs/day: 1.00    Types: Cigars  . Smokeless tobacco: Never Used  Vaping Use  . Vaping Use: Never used  Substance Use Topics  . Alcohol use: Not Currently    Comment: socially  . Drug use: Never     Allergies   Patient has no known allergies.   Review of Systems Review of Systems  Constitutional: Negative for chills and fever.  HENT: Negative for ear pain and sore throat.   Eyes: Negative for pain and visual disturbance.  Respiratory: Negative for cough and shortness of breath.   Cardiovascular: Negative for chest pain and palpitations.  Gastrointestinal: Positive for abdominal pain and constipation. Negative for diarrhea, nausea and vomiting.  Genitourinary: Negative for dysuria, flank pain, hematuria, pelvic pain and vaginal discharge.       Malodorous urine  Musculoskeletal: Negative for arthralgias and back pain.  Skin: Negative for color change and rash.  Neurological: Negative for seizures and syncope.  All other systems reviewed and are negative.  Physical Exam Triage Vital Signs ED Triage Vitals  Enc Vitals Group     BP 01/01/21 1015 111/67     Pulse Rate 01/01/21 1015 66     Resp 01/01/21 1015 18     Temp 01/01/21 1015 98.8 F (37.1 C)     Temp Source 01/01/21 1015 Oral     SpO2 01/01/21 1015 100 %     Weight --      Height --      Head Circumference --      Peak Flow --      Pain Score 01/01/21 1013 3     Pain Loc --      Pain Edu? --      Excl. in GC? --    No data found.  Updated Vital Signs BP 111/67 (BP Location: Right Arm)   Pulse 66   Temp 98.8 F (37.1 C)  (Oral)   Resp 18   LMP 12/21/2020   SpO2 100%   Visual Acuity Right Eye Distance:   Left Eye Distance:   Bilateral Distance:    Right Eye Near:   Left Eye Near:    Bilateral Near:     Physical Exam Vitals and nursing note reviewed.  Constitutional:      General: She is not in acute distress.    Appearance: She is well-developed and well-nourished. She is not ill-appearing.  HENT:     Head: Normocephalic and atraumatic.     Mouth/Throat:     Mouth: Mucous membranes are moist.  Eyes:     Conjunctiva/sclera: Conjunctivae normal.  Cardiovascular:     Rate and Rhythm: Normal rate and regular rhythm.     Heart sounds: Normal heart sounds.  Pulmonary:     Effort: Pulmonary effort is normal. No respiratory distress.     Breath sounds: Normal breath sounds.  Abdominal:     General: Bowel sounds are normal.     Palpations: Abdomen is soft.     Tenderness: There is abdominal tenderness in the right lower quadrant and left lower quadrant. There is no right CVA tenderness, left CVA tenderness, guarding or rebound.     Comments: Mild lower abdominal tenderness, L>R.  No rebound or guarding.  Musculoskeletal:        General: No edema.     Cervical back: Neck supple.  Skin:    General: Skin is warm and dry.  Neurological:     General: No focal deficit present.     Mental Status: She is alert and oriented to person, place, and time.     Gait: Gait normal.  Psychiatric:        Mood and Affect: Mood and affect and mood normal.        Behavior: Behavior normal.      UC Treatments / Results  Labs (all labs ordered are listed, but only abnormal results are displayed) Labs Reviewed  POCT URINALYSIS DIPSTICK, ED / UC  POC URINE PREG, ED    EKG   Radiology No results found.  Procedures Procedures (including critical care time)  Medications Ordered in UC Medications - No data to display  Initial Impression / Assessment and Plan / UC Course  I have reviewed the triage  vital signs and the nursing notes.  Pertinent labs & imaging results that were available during my care of the patient were reviewed by me and considered in my medical decision making (see chart for details).   Lower abdominal pain, constipation, malodorous   urine.  Urine normal and urine pregnancy negative.  Education provided on abdominal pain and also on constipation. Discussed OTC Colace and MiraLAX.  ED precautions for acute abdominal pain or other concerning symptoms discussed.  Instructed her to follow-up with her PCP if her symptoms are not improving.  She agrees to plan of care.    Final Clinical Impressions(s) / UC Diagnoses   Final diagnoses:  Lower abdominal pain  Constipation, unspecified constipation type  Malodorous urine     Discharge Instructions     Take Colace and MiraLAX as directed.    Go to the emergency department if you have acute abdominal pain or other concerning symptoms.    Your urine is normal.  Your pregnancy test is negative.    Follow up with your primary care provider if your symptoms are not improving.        ED Prescriptions    None     PDMP not reviewed this encounter.   Tate, Kelly H, NP 01/01/21 1055  

## 2021-04-01 ENCOUNTER — Other Ambulatory Visit: Payer: Self-pay

## 2021-04-01 ENCOUNTER — Ambulatory Visit (INDEPENDENT_AMBULATORY_CARE_PROVIDER_SITE_OTHER): Payer: Medicaid Other | Admitting: Podiatry

## 2021-04-01 ENCOUNTER — Encounter: Payer: Self-pay | Admitting: Podiatry

## 2021-04-01 DIAGNOSIS — Z720 Tobacco use: Secondary | ICD-10-CM | POA: Diagnosis not present

## 2021-04-01 DIAGNOSIS — Q742 Other congenital malformations of lower limb(s), including pelvic girdle: Secondary | ICD-10-CM

## 2021-04-01 DIAGNOSIS — M722 Plantar fascial fibromatosis: Secondary | ICD-10-CM

## 2021-04-01 DIAGNOSIS — R200 Anesthesia of skin: Secondary | ICD-10-CM

## 2021-04-01 NOTE — Patient Instructions (Signed)

## 2021-04-02 NOTE — Progress Notes (Signed)
Subjective:  Patient ID: Tara Fry, female    DOB: 1991/10/25,  MRN: [id number]  Chief Complaint  Patient presents with  . Foot Pain    Also reports pain in right heel is still present-not as bad as left heel but still present. Also has questions about toe shortening.   . Numbness    Reports numbness still in area where surgery was done by Dr. Allena Katz in right calf.     30 y.o. female presents with the above complaint. History confirmed with patient.  She sees me today for a second opinion.  She had plantar fascial release and gastrocnemius recession with Dr. Allena Katz in March 2021.  Her heel pain is overall better but she does have new worsening heel pain on the left side as well.  She also complains of numbness all around the incision on the calf.  This has not gotten better even after a year after surgery.  Her chief complaint today is elongated second toes that bother her and would like to have them "shortened".  They are optically painful but she does not like the way they feel and make her uncomfortable in her shoes with shoe pressure.  Objective:  Physical Exam: warm, good capillary refill, no trophic changes or ulcerative lesions, normal DP and PT pulses and normal sensory exam.  On the right leg she has a well-healed not hypertrophic incision scar from gastrocnemius recession.  There is no evidence of neuritis or nerve entrapment no pain on percussion or palpation.  She does have loss of sensation in the immediate area within 2 cm of the incision but this did not extend beyond this region and there is no dermatomal distribution.  She does have long second toes that stick out beyond her hallux.  No significant hammertoe or bunion or hyperkeratosis noted.  Mild heel pain the left.  Assessment:   1. Long toe   2. Loss of sensation of skin   3. Tobacco use   4. Plantar fasciitis of left foot      Plan:  Patient was evaluated and treated and all questions answered.  I first  reviewed the exam findings of the right calf incision with her.  Discussed with her that this may never recover and that numbness after surgery along the incision can be permanent.  Doubtful that I will change but this point this far out from surgery although it is possible.  There is no evidence of neuritis or nerve entrapment I do not think that nerve conduction studies would be beneficial and there may not be a fix for this.  Not painful for her.  Plan for stretching exercises to do for heel pain left which she will do for now.  She is about to have elective surgery and cannot take NSAIDs.  If this is not better by the next visit we will begin more aggressive treatment with anti-inflammatories, possible injections.  She cannot have these currently because of upcoming surgery.  Also reviewed the findings of her elongated toes.  I discussed with her surgical and nonsurgical treatment including simply wearing longer shoes.  She says she has tried this and it makes her feel like her shoes are too loose and her foot slides around on them.  We discussed the possibility of shortening the toe with a PIPJ arthrodesis.  I discussed with her that a simple osteotomy in the past has not been very successful and there are risks with this including stiffness, bone and wound healing infections,  this is increased by her history of being a light smoker.  Chronic pain and numbness along these incision is also a possibility.  She will consider this and plan for possible surgery later in this year and she will return to see me this fall we will take x-rays at that time.  Return in about 20 weeks (around 08/19/2021) for take x-rays and plan toe surgery for December.

## 2021-04-09 ENCOUNTER — Ambulatory Visit: Payer: Medicaid Other | Admitting: Podiatry

## 2021-04-16 ENCOUNTER — Ambulatory Visit: Payer: Medicaid Other | Admitting: Family

## 2021-07-08 ENCOUNTER — Encounter: Payer: Medicaid Other | Admitting: Obstetrics and Gynecology

## 2021-07-18 ENCOUNTER — Encounter: Payer: Medicaid Other | Admitting: Obstetrics and Gynecology

## 2021-08-05 ENCOUNTER — Ambulatory Visit: Payer: Medicaid Other | Admitting: Podiatry

## 2021-08-19 ENCOUNTER — Ambulatory Visit: Payer: Medicaid Other | Admitting: Podiatry

## 2022-04-30 ENCOUNTER — Inpatient Hospital Stay: Admit: 2022-04-30 | Payer: Self-pay

## 2023-10-12 ENCOUNTER — Ambulatory Visit (INDEPENDENT_AMBULATORY_CARE_PROVIDER_SITE_OTHER): Payer: Medicaid Other

## 2023-10-12 ENCOUNTER — Encounter: Payer: Self-pay | Admitting: Podiatry

## 2023-10-12 ENCOUNTER — Ambulatory Visit (INDEPENDENT_AMBULATORY_CARE_PROVIDER_SITE_OTHER): Payer: Medicaid Other | Admitting: Podiatry

## 2023-10-12 DIAGNOSIS — M722 Plantar fascial fibromatosis: Secondary | ICD-10-CM | POA: Diagnosis not present

## 2023-10-12 DIAGNOSIS — M778 Other enthesopathies, not elsewhere classified: Secondary | ICD-10-CM

## 2023-10-12 MED ORDER — METHYLPREDNISOLONE 4 MG PO TBPK: ORAL_TABLET | ORAL | 0 refills | Status: DC

## 2023-10-12 MED ORDER — MELOXICAM 15 MG PO TABS: 15.0000 mg | ORAL_TABLET | Freq: Every day | ORAL | 3 refills | Status: DC

## 2023-10-12 NOTE — Patient Instructions (Addendum)
VISIT SUMMARY:  You came in today because you are experiencing pain in your left foot, similar to the plantar fasciitis you had in your right foot that required surgery. The pain is mainly in the center of your heel. We discussed your treatment options and decided to start with oral medications and physical therapy.  YOUR PLAN:  -PLANTAR FASCIITIS: Plantar fasciitis is inflammation of the tissue along the bottom of your foot that connects your heel bone to your toes. We will start you on Prednisone for one week, followed by Meloxicam once daily. You should also do physical therapy exercises twice daily and wear supportive footwear. We will see you again in six weeks to check your progress. If there is no improvement, we may consider an MRI and discuss surgical options.  INSTRUCTIONS:  Please follow up in six weeks, during the first week of January 2025, to check your progress. If your symptoms do not improve, we may need to consider further imaging and discuss surgical options.   Plantar Fasciitis (Heel Spur Syndrome) with Rehab The plantar fascia is a fibrous, ligament-like, soft-tissue structure that spans the bottom of the foot. Plantar fasciitis is a condition that causes pain in the foot due to inflammation of the tissue. SYMPTOMS  Pain and tenderness on the underneath side of the foot. Pain that worsens with standing or walking. CAUSES  Plantar fasciitis is caused by irritation and injury to the plantar fascia on the underneath side of the foot. Common mechanisms of injury include: Direct trauma to bottom of the foot. Damage to a small nerve that runs under the foot where the main fascia attaches to the heel bone. Stress placed on the plantar fascia due to bone spurs. RISK INCREASES WITH:  Activities that place stress on the plantar fascia (running, jumping, pivoting, or cutting). Poor strength and flexibility. Improperly fitted shoes. Tight calf muscles. Flat feet. Failure to  warm-up properly before activity. Obesity. PREVENTION Warm up and stretch properly before activity. Allow for adequate recovery between workouts. Maintain physical fitness: Strength, flexibility, and endurance. Cardiovascular fitness. Maintain a health body weight. Avoid stress on the plantar fascia. Wear properly fitted shoes, including arch supports for individuals who have flat feet.  PROGNOSIS  If treated properly, then the symptoms of plantar fasciitis usually resolve without surgery. However, occasionally surgery is necessary.  RELATED COMPLICATIONS  Recurrent symptoms that may result in a chronic condition. Problems of the lower back that are caused by compensating for the injury, such as limping. Pain or weakness of the foot during push-off following surgery. Chronic inflammation, scarring, and partial or complete fascia tear, occurring more often from repeated injections.  TREATMENT  Treatment initially involves the use of ice and medication to help reduce pain and inflammation. The use of strengthening and stretching exercises may help reduce pain with activity, especially stretches of the Achilles tendon. These exercises may be performed at home or with a therapist. Your caregiver may recommend that you use heel cups of arch supports to help reduce stress on the plantar fascia. Occasionally, corticosteroid injections are given to reduce inflammation. If symptoms persist for greater than 6 months despite non-surgical (conservative), then surgery may be recommended.   MEDICATION  If pain medication is necessary, then nonsteroidal anti-inflammatory medications, such as aspirin and ibuprofen, or other minor pain relievers, such as acetaminophen, are often recommended. Do not take pain medication within 7 days before surgery. Prescription pain relievers may be given if deemed necessary by your caregiver. Use only as  directed and only as much as you need. Corticosteroid injections  may be given by your caregiver. These injections should be reserved for the most serious cases, because they may only be given a certain number of times.  HEAT AND COLD Cold treatment (icing) relieves pain and reduces inflammation. Cold treatment should be applied for 10 to 15 minutes every 2 to 3 hours for inflammation and pain and immediately after any activity that aggravates your symptoms. Use ice packs or massage the area with a piece of ice (ice massage). Heat treatment may be used prior to performing the stretching and strengthening activities prescribed by your caregiver, physical therapist, or athletic trainer. Use a heat pack or soak the injury in warm water.  SEEK IMMEDIATE MEDICAL CARE IF: Treatment seems to offer no benefit, or the condition worsens. Any medications produce adverse side effects.  EXERCISES- RANGE OF MOTION (ROM) AND STRETCHING EXERCISES - Plantar Fasciitis (Heel Spur Syndrome) These exercises may help you when beginning to rehabilitate your injury. Your symptoms may resolve with or without further involvement from your physician, physical therapist or athletic trainer. While completing these exercises, remember:  Restoring tissue flexibility helps normal motion to return to the joints. This allows healthier, less painful movement and activity. An effective stretch should be held for at least 30 seconds. A stretch should never be painful. You should only feel a gentle lengthening or release in the stretched tissue.  RANGE OF MOTION - Toe Extension, Flexion Sit with your right / left leg crossed over your opposite knee. Grasp your toes and gently pull them back toward the top of your foot. You should feel a stretch on the bottom of your toes and/or foot. Hold this stretch for 10 seconds. Now, gently pull your toes toward the bottom of your foot. You should feel a stretch on the top of your toes and or foot. Hold this stretch for 10 seconds. Repeat  times. Complete  this stretch 3 times per day.   RANGE OF MOTION - Ankle Dorsiflexion, Active Assisted Remove shoes and sit on a chair that is preferably not on a carpeted surface. Place right / left foot under knee. Extend your opposite leg for support. Keeping your heel down, slide your right / left foot back toward the chair until you feel a stretch at your ankle or calf. If you do not feel a stretch, slide your bottom forward to the edge of the chair, while still keeping your heel down. Hold this stretch for 10 seconds. Repeat 3 times. Complete this stretch 2 times per day.   STRETCH  Gastroc, Standing Place hands on wall. Extend right / left leg, keeping the front knee somewhat bent. Slightly point your toes inward on your back foot. Keeping your right / left heel on the floor and your knee straight, shift your weight toward the wall, not allowing your back to arch. You should feel a gentle stretch in the right / left calf. Hold this position for 10 seconds. Repeat 3 times. Complete this stretch 2 times per day.  STRETCH  Soleus, Standing Place hands on wall. Extend right / left leg, keeping the other knee somewhat bent. Slightly point your toes inward on your back foot. Keep your right / left heel on the floor, bend your back knee, and slightly shift your weight over the back leg so that you feel a gentle stretch deep in your back calf. Hold this position for 10 seconds. Repeat 3 times. Complete this stretch 2  times per day.  STRETCH  Gastrocsoleus, Standing  Note: This exercise can place a lot of stress on your foot and ankle. Please complete this exercise only if specifically instructed by your caregiver.  Place the ball of your right / left foot on a step, keeping your other foot firmly on the same step. Hold on to the wall or a rail for balance. Slowly lift your other foot, allowing your body weight to press your heel down over the edge of the step. You should feel a stretch in your right /  left calf. Hold this position for 10 seconds. Repeat this exercise with a slight bend in your right / left knee. Repeat 3 times. Complete this stretch 2 times per day.   STRENGTHENING EXERCISES - Plantar Fasciitis (Heel Spur Syndrome)  These exercises may help you when beginning to rehabilitate your injury. They may resolve your symptoms with or without further involvement from your physician, physical therapist or athletic trainer. While completing these exercises, remember:  Muscles can gain both the endurance and the strength needed for everyday activities through controlled exercises. Complete these exercises as instructed by your physician, physical therapist or athletic trainer. Progress the resistance and repetitions only as guided.  STRENGTH - Towel Curls Sit in a chair positioned on a non-carpeted surface. Place your foot on a towel, keeping your heel on the floor. Pull the towel toward your heel by only curling your toes. Keep your heel on the floor. Repeat 3 times. Complete this exercise 2 times per day.  STRENGTH - Ankle Inversion Secure one end of a rubber exercise band/tubing to a fixed object (table, pole). Loop the other end around your foot just before your toes. Place your fists between your knees. This will focus your strengthening at your ankle. Slowly, pull your big toe up and in, making sure the band/tubing is positioned to resist the entire motion. Hold this position for 10 seconds. Have your muscles resist the band/tubing as it slowly pulls your foot back to the starting position. Repeat 3 times. Complete this exercises 2 times per day.  Document Released: 11/10/2005 Document Revised: 02/02/2012 Document Reviewed: 02/22/2009 [facility] Patient Information 2014 [place], [place].

## 2023-10-13 NOTE — Progress Notes (Signed)
  Subjective:  Patient ID: Tara Fry, female    DOB: [birth year],  MRN: [id number]  Chief Complaint  Patient presents with   Foot Pain    RM#20 Patient states had previous right foot for plantar fasciitis now has left foot pain similar pain.Has been a previous patient and was schedules to have a treatment plan but was unable to return until now.    Discussed the use of AI scribe software for clinical note transcription with the patient, who gave verbal consent to proceed.  History of Present Illness   The patient, with a history of right foot plantar fasciitis that required surgical intervention, now presents with similar symptoms in the left foot. The pain is primarily located in the center of the foot, underneath the heel. The patient reports that the pain is not as severe on the sides or in the arch of the foot. The patient had previously tried oral medications and steroid injections for the right foot, which provided temporary relief but ultimately required surgery. The patient expresses fear of the injections and prefers to try oral medication first. The patient also mentions a previous discussion about toe shortening to help with heel pain.          Objective:    Physical Exam   EXTREMITIES: Left foot warm and well-padded, palpable pulses, good capillary fill time, tender palpation of the central plantar heel, no tenderness in the midplantar fascia, no ecchymosis or bruising noted.       No images are attached to the encounter.    Results   RADIOLOGY Left foot radiographs: No acute fracture or stress fracture. Small plantar calcaneal enthesophyte. (10/09/2023)      Assessment:   1. Capsulitis of foot      Plan:  Patient was evaluated and treated and all questions answered.  Assessment and Plan    Plantar Fasciitis   She presents with left foot pain, notably worse in the central plantar heel, and has a history of right foot plantar fasciitis treated surgically.  She is hesitant to try steroid injections due to past experiences. Radiographs reveal a small plantar calcaneal enthesophyte. We will start Prednisone for one week followed by Meloxicam once daily and initiate physical therapy exercises twice daily. Encouraging good supportive footwear is essential. A follow-up in six weeks, the first week of January 2025, is scheduled. If there is no improvement, we will consider an MRI and discuss surgical options.          Return in about 6 weeks (around 11/23/2023) for recheck plantar fasciitis.

## 2023-11-23 ENCOUNTER — Ambulatory Visit (INDEPENDENT_AMBULATORY_CARE_PROVIDER_SITE_OTHER): Payer: Medicaid Other | Admitting: Podiatry

## 2023-11-23 DIAGNOSIS — Z91199 Patient's noncompliance with other medical treatment and regimen due to unspecified reason: Secondary | ICD-10-CM

## 2024-10-23 ENCOUNTER — Emergency Department
Admission: EM | Admit: 2024-10-23 | Discharge: 2024-10-23 | Disposition: A | Discharge status: Home / Self Care | Attending: Emergency Medicine | Admitting: Emergency Medicine

## 2024-10-23 ENCOUNTER — Encounter: Payer: Self-pay | Admitting: Emergency Medicine

## 2024-10-23 ENCOUNTER — Other Ambulatory Visit: Payer: Self-pay

## 2024-10-23 DIAGNOSIS — J069 Acute upper respiratory infection, unspecified: Secondary | ICD-10-CM | POA: Diagnosis not present

## 2024-10-23 DIAGNOSIS — R059 Cough, unspecified: Secondary | ICD-10-CM | POA: Diagnosis present

## 2024-10-23 DIAGNOSIS — J45909 Unspecified asthma, uncomplicated: Secondary | ICD-10-CM | POA: Diagnosis not present

## 2024-10-23 LAB — RESP PANEL BY RT-PCR (RSV, FLU A&B, COVID)  RVPGX2
Influenza A by PCR: NEGATIVE
Influenza B by PCR: NEGATIVE
Resp Syncytial Virus by PCR: NEGATIVE
SARS Coronavirus 2 by RT PCR: NEGATIVE

## 2024-10-23 LAB — GROUP A STREP BY PCR: Group A Strep by PCR: NOT DETECTED

## 2024-10-23 MED ORDER — BENZONATATE 100 MG PO CAPS: 200.0000 mg | ORAL_CAPSULE | Freq: Three times a day (TID) | ORAL | 0 refills | Status: AC | PRN

## 2024-10-23 MED ORDER — ONDANSETRON 4 MG PO TBDP
4.0000 mg | ORAL_TABLET | Freq: Once | ORAL | Status: AC
  Administered 2024-10-23: 4 mg via ORAL
  Filled 2024-10-23: qty 1

## 2024-10-23 MED ORDER — DEXTROMETHORPHAN POLISTIREX ER 30 MG/5ML PO SUER
30.0000 mg | Freq: Once | ORAL | Status: AC
  Administered 2024-10-23: 30 mg via ORAL
  Filled 2024-10-23: qty 5

## 2024-10-23 MED ORDER — PREDNISONE 20 MG PO TABS
60.0000 mg | ORAL_TABLET | Freq: Once | ORAL | Status: AC
  Administered 2024-10-23: 60 mg via ORAL
  Filled 2024-10-23: qty 3

## 2024-10-23 MED ORDER — BENZONATATE 100 MG PO CAPS
200.0000 mg | ORAL_CAPSULE | Freq: Once | ORAL | Status: AC
  Administered 2024-10-23: 200 mg via ORAL
  Filled 2024-10-23: qty 2

## 2024-10-23 MED ORDER — PREDNISONE 20 MG PO TABS: 20.0000 mg | ORAL_TABLET | Freq: Two times a day (BID) | ORAL | 0 refills | Status: AC

## 2024-10-23 MED ORDER — ACETAMINOPHEN 325 MG PO TABS
650.0000 mg | ORAL_TABLET | Freq: Once | ORAL | Status: AC
  Administered 2024-10-23: 650 mg via ORAL
  Filled 2024-10-23: qty 2

## 2024-10-23 MED ORDER — ALBUTEROL SULFATE HFA 108 (90 BASE) MCG/ACT IN AERS: 2.0000 | INHALATION_SPRAY | Freq: Four times a day (QID) | RESPIRATORY_TRACT | 0 refills | Status: AC | PRN

## 2024-10-23 NOTE — ED Notes (Addendum)
 Pt requesting some pain medications for a headache. EDP notified.

## 2024-10-23 NOTE — ED Provider Notes (Signed)
 [facility] Emergency Department Provider Note     Event Date/Time   First MD Initiated Contact with Patient 10/23/24 2100     (approximate)   History   Generalized Body Aches and Nasal Congestion   HPI  Tara Fry is a 33 y.o. female with history of asthma, presents to the ED with reports of cough, congestion, fatigue, and subjective fever since yesterday.  She is taking Tylenol  Cold and flu with symptoms but denies any significant benefit.  Patient gives her history of asthma and does not have a current albuterol  inhaler.  No reports of any sick contacts, recent travel, or other exposures.  Physical Exam   Triage Vital Signs: ED Triage Vitals  Encounter Vitals Group     BP 10/23/24 1919 117/72     Girls Systolic BP Percentile --      Girls Diastolic BP Percentile --      Boys Systolic BP Percentile --      Boys Diastolic BP Percentile --      Pulse Rate 10/23/24 1919 (!) 105     Resp 10/23/24 1919 19     Temp 10/23/24 1919 99.1 F (37.3 C)     Temp Source 10/23/24 1919 Oral     SpO2 10/23/24 1919 99 %     Weight --      Height --      Head Circumference --      Peak Flow --      Pain Score 10/23/24 1920 0     Pain Loc --      Pain Education --      Exclude from Growth Chart --     Most recent vital signs: Vitals:   10/23/24 1919  BP: 117/72  Pulse: (!) 105  Resp: 19  Temp: 99.1 F (37.3 C)  SpO2: 99%    General Awake, no distress. NAD HEENT NCAT. PERRL. EOMI. No rhinorrhea. Mucous membranes are moist.  CV:  Good peripheral perfusion.  RESP:  Normal effort.  No wheeze, rale, rhonchi.  Intermittent cough noted ABD:  No distention.   ED Results / Procedures / Treatments   Labs (all labs ordered are listed, but only abnormal results are displayed) Labs Reviewed  RESP PANEL BY RT-PCR (RSV, FLU A&B, COVID)  RVPGX2  GROUP A STREP BY PCR    EKG   RADIOLOGY   No results found.   PROCEDURES:  Critical Care  performed: No  Procedures   MEDICATIONS ORDERED IN ED: Medications  benzonatate  (TESSALON ) capsule 200 mg (200 mg Oral Given 10/23/24 2251)  predniSONE (DELTASONE) tablet 60 mg (60 mg Oral Given 10/23/24 2251)  dextromethorphan (DELSYM) 30 MG/5ML liquid 30 mg (30 mg Oral Given 10/23/24 2252)  acetaminophen  (TYLENOL ) tablet 650 mg (650 mg Oral Given 10/23/24 2251)  ondansetron (ZOFRAN-ODT) disintegrating tablet 4 mg (4 mg Oral Given 10/23/24 2252)     IMPRESSION / MDM / ASSESSMENT AND PLAN / ED COURSE  I reviewed the triage vital signs and the nursing notes.                              Differential diagnosis includes, but is not limited to, COVID, flu, RSV, viral URI, strep pharyngitis  Patient's presentation is most consistent with acute complicated illness / injury requiring diagnostic workup.  Patient's diagnosis is consistent with viral URI with cough.  She presents with 1 day complaint of cough, congestion,  and subjective fevers.  Viral panel test is negative at this time.  Strep PCR is pending.  Patient in no acute respiratory distress.  She is treated with doses of Delsym, Zofran, Tessalon  Perles, prednisone, and Tylenol .  Patient will be discharged home with prescriptions for prednisone, albuterol , and Tessalon  Perles.  Patient is also advised to take OTC Delsym cough syrup, and doses of Tylenol  and Motrin  separately as needed.  Patient is to follow up with her primary provider as needed or otherwise directed. Patient is given ED precautions to return to the ED for any worsening or new symptoms.  Patient will be notified via telephone if her strep PCR should require treatment.  FINAL CLINICAL IMPRESSION(S) / ED DIAGNOSES   Final diagnoses:  Viral URI with cough     Rx / DC Orders   ED Discharge Orders          Ordered    benzonatate  (TESSALON ) 100 MG capsule  3 times daily PRN        10/23/24 2209    predniSONE (DELTASONE) 20 MG tablet  2 times daily with meals         10/23/24 2209    albuterol  (VENTOLIN  HFA) 108 (90 Base) MCG/ACT inhaler  Every 6 hours PRN        10/23/24 2209             Note:  This document was prepared using Dragon voice recognition software and may include unintentional dictation errors.    Loyd Candida LULLA Aldona, PA-C 10/23/24 2310    Floy Roberts, MD 10/23/24 [phone]

## 2024-10-23 NOTE — ED Triage Notes (Signed)
 Pt reports feeling congested, fatigued, fevers since yesterday. Reports taking tylenol  cold & flu. Last given around 2pm.

## 2024-10-23 NOTE — Discharge Instructions (Addendum)
 Take the prescription meds as directed.  Continue to rest and hydrate as discussed.  Follow-up with primary provider for ongoing evaluation.  Return to ED if needed.  Consider retesting in 2 to 3 days if symptoms persist.

## 2024-11-10 ENCOUNTER — Ambulatory Visit: Admitting: Podiatry

## 2024-12-05 ENCOUNTER — Other Ambulatory Visit: Payer: Self-pay

## 2024-12-05 ENCOUNTER — Encounter: Payer: Self-pay | Admitting: Emergency Medicine

## 2024-12-05 ENCOUNTER — Emergency Department
Admission: EM | Admit: 2024-12-05 | Discharge: 2024-12-05 | Disposition: A | Discharge status: Home / Self Care | Attending: Emergency Medicine | Admitting: Emergency Medicine

## 2024-12-05 DIAGNOSIS — J111 Influenza due to unidentified influenza virus with other respiratory manifestations: Secondary | ICD-10-CM

## 2024-12-05 DIAGNOSIS — J029 Acute pharyngitis, unspecified: Secondary | ICD-10-CM | POA: Diagnosis present

## 2024-12-05 DIAGNOSIS — J069 Acute upper respiratory infection, unspecified: Secondary | ICD-10-CM | POA: Insufficient documentation

## 2024-12-05 LAB — RESP PANEL BY RT-PCR (RSV, FLU A&B, COVID)  RVPGX2
Influenza A by PCR: NEGATIVE
Influenza B by PCR: NEGATIVE
Resp Syncytial Virus by PCR: NEGATIVE
SARS Coronavirus 2 by RT PCR: NEGATIVE

## 2024-12-05 LAB — GROUP A STREP BY PCR: Group A Strep by PCR: NOT DETECTED

## 2024-12-05 NOTE — Discharge Instructions (Addendum)
 You have been seen in the Emergency Department (ED) today for a likely viral illness.  Please drink plenty of clear fluids (water, Gatorade, chicken broth, etc).  You may use Tylenol and/or Motrin according to label instructions.  You can alternate between the two without any side effects.   Please follow up with your doctor as listed above.  Call your doctor or return to the Emergency Department (ED) if you are unable to tolerate fluids due to vomiting, have worsening trouble breathing, become extremely tired or difficult to awaken, or if you develop any other symptoms that concern you.

## 2024-12-05 NOTE — ED Provider Notes (Signed)
 "  [facility] Provider Note    Event Date/Time   First MD Initiated Contact with Patient 12/05/24 0345     (approximate)   History   Sore Throat and Chills   HPI Tara Fry is a 34 y.o. female who presents for evaluation of 1 day of chills, sore throat, and general malaise.  Some nasal congestion.  No shortness of breath or cough.  She works at the hospital at [employer] and is afraid she might have gotten a virus.  No chest pain.     Physical Exam   Triage Vital Signs: ED Triage Vitals  Encounter Vitals Group     BP 12/05/24 0236 (!) 145/80     Girls Systolic BP Percentile --      Girls Diastolic BP Percentile --      Boys Systolic BP Percentile --      Boys Diastolic BP Percentile --      Pulse Rate 12/05/24 0236 87     Resp 12/05/24 0236 18     Temp 12/05/24 0236 98.2 F (36.8 C)     Temp Source 12/05/24 0236 Oral     SpO2 12/05/24 0236 98 %     Weight --      Height --      Head Circumference --      Peak Flow --      Pain Score 12/05/24 0237 5     Pain Loc --      Pain Education --      Exclude from Growth Chart --     Most recent vital signs: Vitals:   12/05/24 0236  BP: (!) 145/80  Pulse: 87  Resp: 18  Temp: 98.2 F (36.8 C)  SpO2: 98%    General: Awake, no distress.  Pleasant and conversant, generally well-appearing. HEENT: Bilateral ears are clear and clean and nonerythematous, normal-appearing tympanic membranes.  Posterior oropharynx is mildly erythematous but without petechiae nor exudate. CV:  Good peripheral perfusion.  Regular rate and rhythm.  Normal heart sounds. Resp:  Normal effort. Speaking easily and comfortably, no accessory muscle usage nor intercostal retractions.  Lungs are clear to auscultation bilaterally. Abd:  No distention.    ED Results / Procedures / Treatments   Labs (all labs ordered are listed, but only abnormal results are displayed) Labs Reviewed  GROUP A STREP BY PCR  RESP PANEL BY RT-PCR  (RSV, FLU A&B, COVID)  RVPGX2     PROCEDURES:  Critical Care performed: No  Procedures    IMPRESSION / MDM / ASSESSMENT AND PLAN / ED COURSE  I reviewed the triage vital signs and the nursing notes.                              Differential diagnosis includes, but is not limited to, viral illness, pneumonia, bacterial pharyngitis  Patient's presentation is most consistent with acute presentation with potential threat to life or bodily function.  Labs/studies ordered (see ED course for additional labs and studies that may have been added later): Respiratory viral panel, group A strep PCR  Interventions/Medications given:  Medications - No data to display  (Note:  hospital course my include additional interventions and/or labs/studies not listed above.)   Reassuring vital signs, negative viral testing, symptoms sound consistent with viral illness.  No additional testing required.  Patient is comfortable with the plan for discharge and outpatient follow-up.  FINAL CLINICAL IMPRESSION(S) / ED DIAGNOSES   Final diagnoses:  Viral URI  Influenza-like illness     Rx / DC Orders   ED Discharge Orders     None        Note:  This document was prepared using Dragon voice recognition software and may include unintentional dictation errors.   Gordan Huxley, MD 12/05/24 [phone]  "

## 2024-12-05 NOTE — ED Notes (Signed)
Pt ambulatory to room.

## 2024-12-05 NOTE — ED Triage Notes (Signed)
 Pt arrives POV, ambulatory to triage, gait steady, no acute distress noted c/o sore throat since yesterday w/ intermittent chills. Denies other s/s.

## 2024-12-06 ENCOUNTER — Ambulatory Visit: Admitting: Podiatry

## 2024-12-13 ENCOUNTER — Encounter: Payer: Self-pay | Admitting: Podiatry

## 2024-12-13 ENCOUNTER — Ambulatory Visit: Admitting: Podiatry

## 2024-12-13 DIAGNOSIS — M216X2 Other acquired deformities of left foot: Secondary | ICD-10-CM

## 2024-12-13 DIAGNOSIS — M722 Plantar fascial fibromatosis: Secondary | ICD-10-CM

## 2024-12-13 DIAGNOSIS — M216X1 Other acquired deformities of right foot: Secondary | ICD-10-CM

## 2024-12-13 NOTE — Patient Instructions (Signed)

## 2024-12-15 ENCOUNTER — Encounter: Payer: Self-pay | Admitting: Podiatry

## 2024-12-15 NOTE — Progress Notes (Signed)
"   °  Subjective:  Patient ID: Tara Fry, female    DOB: 10-26-1991,  MRN: [id number]  Chief Complaint  Patient presents with   Plantar Fasciitis    It's fine.       History of Present Illness   She returns for follow-up today with little to no improvement.  Would like to have a repeat injection today.  Previously had multiple injections and physical therapy without full relief.         Objective:    Physical Exam   EXTREMITIES: Left foot warm and well-padded, palpable pulses, good capillary fill time, tender palpation of the central plantar heel and at the insertion of the medial band of the plantar fascia      No images are attached to the encounter.    Results   RADIOLOGY Left foot radiographs taken on 10/12/2023: No acute fracture or stress fracture. Small plantar calcaneal enthesophyte. (10/09/2023)      Assessment:      ICD-10-CM   1. Plantar fasciitis of left foot  M72.2 MR HEEL LEFT WO CONTRAST    2. 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  Assessment and Plan    Plantar Fasciitis   Discussed the etiology and treatment options for plantar fasciitis including stretching, formal physical therapy, supportive shoegears such as a running shoe or sneaker, pre fabricated orthoses, injection therapy, and oral medications. We also discussed the role of surgical treatment of this for patients who do not improve after exhausting non-surgical treatment options.   -Educated patient on stretching and icing of the affected limb -Injection delivered to the plantar fascia of the left foot. - Overall she is interested in proceeding with surgical intervention at this point.  Previous had plantar fasciotomy of her right foot with Dr. Tobie and improved greatly from this.  MRI has been ordered for surgical planning. - Also discussed long-term for the custom of  the foot orthosis before or after surgery.  This will offload the medial longitudinal arch prevent recurrence of her condition.  She was scanned for these today and orders will be placed.  Follow-up with me for pickup for this as needed.   After sterile prep with povidone-iodine solution and alcohol, the left heel was injected with 0.5cc 2% xylocaine plain, 0.5cc 0.5% marcaine plain, 20 mg triamcinolone  acetonide, and 4 mg dexamethasone was injected along the medial plantar fascia at the insertion on the plantar calcaneus. The patient tolerated the procedure well without complication.  "

## 2024-12-21 ENCOUNTER — Telehealth: Payer: Self-pay

## 2024-12-21 DIAGNOSIS — M722 Plantar fascial fibromatosis: Secondary | ICD-10-CM

## 2024-12-21 NOTE — Telephone Encounter (Signed)
 Insurance is requiring recent prior imaging done within the last 3 months for MRI approval.

## 2024-12-21 NOTE — Telephone Encounter (Signed)
 I called patient and left a message letting her know that there has been an order placed for an xray. Dr. Silva placed order for [facility]. Patient can walk in and have it done. The xray order was placed due to insurance requiring it before an MRI can be approved.

## 2024-12-27 ENCOUNTER — Ambulatory Visit: Admitting: Podiatry

## 2024-12-29 ENCOUNTER — Ambulatory Visit: Admitting: Podiatry

## 2025-01-13 ENCOUNTER — Other Ambulatory Visit
